# Patient Record
Sex: Female | Born: 1990 | Race: Black or African American | Hispanic: No | Marital: Single | Smoking: Former smoker
Health system: Southern US, Community
[De-identification: ages and names within clinical notes are randomized; demographics above are authoritative.]

## PROBLEM LIST (undated history)

## (undated) ENCOUNTER — Inpatient Hospital Stay (HOSPITAL_COMMUNITY): Payer: Self-pay

## (undated) DIAGNOSIS — N390 Urinary tract infection, site not specified: Secondary | ICD-10-CM

## (undated) DIAGNOSIS — O139 Gestational [pregnancy-induced] hypertension without significant proteinuria, unspecified trimester: Secondary | ICD-10-CM

## (undated) DIAGNOSIS — D573 Sickle-cell trait: Secondary | ICD-10-CM

## (undated) DIAGNOSIS — B009 Herpesviral infection, unspecified: Secondary | ICD-10-CM

## (undated) HISTORY — DX: Gestational (pregnancy-induced) hypertension without significant proteinuria, unspecified trimester: O13.9

## (undated) HISTORY — PX: DILATION AND CURETTAGE OF UTERUS: SHX78

---
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
## 2011-02-23 NOTE — Discharge Summary (Signed)
NAMEMORINE, KOHLMAN            ACCOUNT NO.:  [id number]   MEDICAL RECORD NO.:  [id number]          PATIENT TYPE:  INP   LOCATION:  9309                          FACILITY:  [facility]   PHYSICIAN:  Tracy L. Mayford Knife, M.D.DATE OF BIRTH:  01/20/1991   DATE OF ADMISSION:  11/06/2006  DATE OF DISCHARGE:  11/08/2006                               DISCHARGE SUMMARY   ADMISSION DIAGNOSES:  1. Pyelonephritis.  2. Status post normal spontaneous vaginal delivery 32 days prior to      admission.   DISCHARGE DIAGNOSIS:  Pyelonephritis (Escherichia coli).   LABORATORY:  Urine culture:  Greater than 100,000 colonies of E. coli  sensitive to everything except for ampicillin.  Blood cultures:  No  growth after 5 days.  Hemoglobin 10 and following hydration was 9.1.   HOSPITAL COURSE:  A 20 year old G1, P1-0-0-1 status post normal  spontaneous vaginal delivery 32 days prior to admission presents to MAU  complaining of fever x3 days.  She had a T-max of 103.7 on the day of  admission.  She complained of right flank pain.  Urinalysis was found to  be very dirty and she had positive right CVA tenderness, so she was  admitted for observation and IV antibiotics.  The patient continued to  spike fevers on Rocephin so she was changed to Cipro.  The patient  tolerated the medications well and became afebrile for greater than 24  hours, so she was discharged to home.   FOLLOWUP:  [facility] on [facility], [facility].  She is to come to  the MAU earlier if there are any problems.   DISCHARGE MEDICATIONS:  1. Cipro 500 mg b.i.d. for 14 days.  2. Tylenol p.r.n.           ______________________________  Marc Morgans. Mayford Knife, M.D.     TLW/MEDQ  D:  11/27/2006  T:  11/27/2006  Job:  [id number]

## 2011-03-25 ENCOUNTER — Emergency Department (HOSPITAL_COMMUNITY)
Admission: EM | Admit: 2011-03-25 | Discharge: 2011-03-25 | Disposition: A | Discharge status: Home / Self Care | Payer: Medicaid Other | Attending: Emergency Medicine | Admitting: Emergency Medicine

## 2011-03-25 DIAGNOSIS — D573 Sickle-cell trait: Secondary | ICD-10-CM | POA: Insufficient documentation

## 2011-03-25 DIAGNOSIS — R112 Nausea with vomiting, unspecified: Secondary | ICD-10-CM | POA: Insufficient documentation

## 2011-03-25 DIAGNOSIS — R109 Unspecified abdominal pain: Secondary | ICD-10-CM | POA: Insufficient documentation

## 2011-03-25 LAB — COMPREHENSIVE METABOLIC PANEL
ALT: 13 U/L (ref 0–35)
AST: 26 U/L (ref 0–37)
Alkaline Phosphatase: 70 U/L (ref 39–117)
CO2: 26 mEq/L (ref 19–32)
Calcium: 8.8 mg/dL (ref 8.4–10.5)
Chloride: 107 mEq/L (ref 96–112)
GFR calc Af Amer: >60 mL/min (ref >60)
GFR calc non Af Amer: >60 mL/min (ref >60)
Glucose, Bld: 85 mg/dL (ref 70–99)
Potassium: 3.8 mEq/L (ref 3.5–5.1)
Sodium: 139 mEq/L (ref 135–145)
Total Bilirubin: 0.3 mg/dL (ref 0.3–1.2)

## 2011-03-25 LAB — CBC
HCT: 33.7 % — ABNORMAL LOW (ref 36.0–46.0)
MCHC: 33.8 g/dL (ref 30.0–36.0)
MCV: 82.6 fL (ref 78.0–100.0)
Platelets: 289 10*3/uL (ref 150–400)

## 2011-03-25 LAB — URINALYSIS, ROUTINE W REFLEX MICROSCOPIC
Nitrite: NEGATIVE
Protein, ur: NEGATIVE mg/dL
Urobilinogen, UA: 1 mg/dL (ref 0.0–1.0)

## 2011-03-25 LAB — URINE MICROSCOPIC-ADD ON

## 2011-03-25 LAB — DIFFERENTIAL
Basophils Absolute: 0 10*3/uL (ref 0.0–0.1)
Eosinophils Absolute: 0.1 10*3/uL (ref 0.0–0.7)
Eosinophils Relative: 1 % (ref 0–5)
Lymphocytes Relative: 33 % (ref 12–46)
Monocytes Absolute: 0.4 10*3/uL (ref 0.1–1.0)

## 2011-03-25 LAB — POCT PREGNANCY, URINE: Preg Test, Ur: NEGATIVE

## 2011-03-26 LAB — GC/CHLAMYDIA PROBE AMP, GENITAL
Chlamydia, DNA Probe: NEGATIVE
GC Probe Amp, Genital: NEGATIVE

## 2011-09-18 ENCOUNTER — Encounter: Payer: Self-pay | Admitting: Emergency Medicine

## 2011-09-18 ENCOUNTER — Emergency Department (HOSPITAL_COMMUNITY)
Admission: EM | Admit: 2011-09-18 | Discharge: 2011-09-18 | Disposition: A | Discharge status: Home / Self Care | Payer: Medicaid Other | Attending: Emergency Medicine | Admitting: Emergency Medicine

## 2011-09-18 DIAGNOSIS — IMO0001 Reserved for inherently not codable concepts without codable children: Secondary | ICD-10-CM | POA: Insufficient documentation

## 2011-09-18 DIAGNOSIS — R05 Cough: Secondary | ICD-10-CM | POA: Insufficient documentation

## 2011-09-18 DIAGNOSIS — R51 Headache: Secondary | ICD-10-CM | POA: Insufficient documentation

## 2011-09-18 DIAGNOSIS — R509 Fever, unspecified: Secondary | ICD-10-CM | POA: Insufficient documentation

## 2011-09-18 DIAGNOSIS — R059 Cough, unspecified: Secondary | ICD-10-CM | POA: Insufficient documentation

## 2011-09-18 DIAGNOSIS — R070 Pain in throat: Secondary | ICD-10-CM | POA: Insufficient documentation

## 2011-09-18 DIAGNOSIS — R112 Nausea with vomiting, unspecified: Secondary | ICD-10-CM | POA: Insufficient documentation

## 2011-09-18 DIAGNOSIS — J111 Influenza due to unidentified influenza virus with other respiratory manifestations: Secondary | ICD-10-CM | POA: Insufficient documentation

## 2011-09-18 DIAGNOSIS — R197 Diarrhea, unspecified: Secondary | ICD-10-CM | POA: Insufficient documentation

## 2011-09-18 MED ORDER — NAPROXEN 500 MG PO TABS: 500.0000 mg | ORAL_TABLET | Freq: Two times a day (BID) | ORAL | Status: DC

## 2011-09-18 MED ORDER — KETOROLAC TROMETHAMINE 60 MG/2ML IM SOLN
INTRAMUSCULAR | Status: AC
  Administered 2011-09-18: 60 mg via INTRAMUSCULAR
  Filled 2011-09-18: qty 2

## 2011-09-18 NOTE — ED Notes (Signed)
MD at bedside. 

## 2011-09-18 NOTE — ED Provider Notes (Signed)
History     CSN: [id number] Arrival date & time: 09/18/2011 12:13 AM   First MD Initiated Contact with Patient 09/18/11 0340      Chief Complaint  Patient presents with  . Emesis    (Consider location/radiation/quality/duration/timing/severity/associated sxs/prior treatment) HPI Comments: 20 year old otherwise healthy female, presents with one day of body aches, chills, fever, sore throat, cough, nausea and vomiting with diarrhea. He has a close relative that was just diagnosed with the flu. Symptoms are constant, persistent, nothing makes better or worse, not associated with rash or swelling.  She does have a headache which she sites as the most significant of her symptoms tonight. She said no medication prior to arrival and states that her symptoms are severe she denies stiff neck, change in vision, weakness or numbness  Patient is a 20 y.o. female presenting with vomiting. The history is provided by the patient.  Emesis     History reviewed. No pertinent past medical history.  History reviewed. No pertinent past surgical history.  No family history on file.  History  Substance Use Topics  . Smoking status: Current Everyday Smoker  . Smokeless tobacco: Not on file  . Alcohol Use: No    OB History    Grav Para Term Preterm Abortions TAB SAB Ect Mult Living                  Review of Systems  Gastrointestinal: Positive for vomiting.  All other systems reviewed and are negative.    Allergies  Review of patient's allergies indicates no known allergies.  Home Medications   Current Outpatient Rx  Name Route Sig Dispense Refill  . NAPROXEN 500 MG PO TABS Oral Take 1 tablet (500 mg total) by mouth 2 (two) times daily with a meal. 30 tablet 0    BP 136/68  Pulse 88  Temp(Src) 98.2 F (36.8 C) (Oral)  Resp 20  SpO2 97%  LMP 09/09/2011  Physical Exam  Nursing note and vitals reviewed. Constitutional: She appears well-developed and well-nourished. No distress.   HENT:  Head: Normocephalic and atraumatic.  Mouth/Throat: No oropharyngeal exudate.       Posterior pharynx erythematous, no asymmetry hypertrophy or exudate  Eyes: Conjunctivae and EOM are normal. Pupils are equal, round, and reactive to light. Right eye exhibits no discharge. Left eye exhibits no discharge. No scleral icterus.  Neck: Normal range of motion. Neck supple. No JVD present. No thyromegaly present.  Cardiovascular: Normal rate, regular rhythm, normal heart sounds and intact distal pulses.  Exam reveals no gallop and no friction rub.   No murmur heard. Pulmonary/Chest: Effort normal and breath sounds normal. No respiratory distress. She has no wheezes. She has no rales.  Abdominal: Soft. Bowel sounds are normal. She exhibits no distension and no mass. There is no tenderness.  Musculoskeletal: Normal range of motion. She exhibits no edema and no tenderness.  Lymphadenopathy:    She has no cervical adenopathy.  Neurological: She is alert. Coordination normal.  Skin: Skin is warm and dry. No rash noted. No erythema.  Psychiatric: She has a normal mood and affect. Her behavior is normal.    ED Course  Procedures (including critical care time)  Labs Reviewed - No data to display No results found.   1. Influenza       MDM  Myalgias fevers chills nausea vomiting cough sore throat, this is consistent with influenza, no focal findings on pulmonary exam or vital signs to suggest another source of disease. Also  given close contacts with same disease is most likely. Intramuscular Toradol given, home with Tamiflu as per patient request  Improved after Toradol intramuscular, vital signs stable, home, precautions given for flu      Vida Roller, MD 09/18/11 [phone]

## 2011-09-18 NOTE — ED Notes (Signed)
PT. REPORTS VOMITTING AND DIARRHEA , RUNNY NOSE/NASAL CONGESTION , PRODUCTIVE COUGH , CHILLS AND HEADACHE X SEVERAL DAYS  WITH BODY ACHES.

## 2012-03-29 ENCOUNTER — Encounter (HOSPITAL_COMMUNITY): Payer: Self-pay | Admitting: *Deleted

## 2012-03-29 ENCOUNTER — Inpatient Hospital Stay (HOSPITAL_COMMUNITY)
Admission: AD | Admit: 2012-03-29 | Discharge: 2012-03-29 | Disposition: A | Discharge status: Home / Self Care | Payer: Medicaid Other | Source: Ambulatory Visit | Attending: Family Medicine | Admitting: Family Medicine

## 2012-03-29 DIAGNOSIS — N938 Other specified abnormal uterine and vaginal bleeding: Secondary | ICD-10-CM | POA: Insufficient documentation

## 2012-03-29 DIAGNOSIS — N898 Other specified noninflammatory disorders of vagina: Secondary | ICD-10-CM

## 2012-03-29 DIAGNOSIS — N939 Abnormal uterine and vaginal bleeding, unspecified: Secondary | ICD-10-CM

## 2012-03-29 DIAGNOSIS — R109 Unspecified abdominal pain: Secondary | ICD-10-CM | POA: Insufficient documentation

## 2012-03-29 DIAGNOSIS — N949 Unspecified condition associated with female genital organs and menstrual cycle: Secondary | ICD-10-CM | POA: Insufficient documentation

## 2012-03-29 HISTORY — DX: Herpesviral infection, unspecified: B00.9

## 2012-03-29 LAB — URINALYSIS, ROUTINE W REFLEX MICROSCOPIC
Bilirubin Urine: NEGATIVE
Leukocytes, UA: NEGATIVE
Nitrite: NEGATIVE
Specific Gravity, Urine: 1.025 (ref 1.005–1.030)
Urobilinogen, UA: 0.2 mg/dL (ref 0.0–1.0)
pH: 6 (ref 5.0–8.0)

## 2012-03-29 LAB — URINE MICROSCOPIC-ADD ON

## 2012-03-29 LAB — WET PREP, GENITAL

## 2012-03-29 MED ORDER — KETOROLAC TROMETHAMINE 60 MG/2ML IM SOLN
60.0000 mg | Freq: Once | INTRAMUSCULAR | Status: AC
  Administered 2012-03-29: 60 mg via INTRAMUSCULAR
  Filled 2012-03-29: qty 2

## 2012-03-29 MED ORDER — IBUPROFEN 600 MG PO TABS: 600.0000 mg | ORAL_TABLET | Freq: Four times a day (QID) | ORAL | Status: AC | PRN

## 2012-03-29 NOTE — MAU Provider Note (Signed)
History     CSN: [id number]  Arrival date and time: 03/29/12 9562   First Provider Initiated Contact with Patient 03/29/12 [phone]      Chief Complaint  Patient presents with  . Abdominal Pain  . Vaginal Bleeding   HPI Kara Myers 21 y.o. MAU pregnancy test is negative.  Comes to MAU today with abdominal pain x 4 days and dark vaginal bleeding.  Missed appointment for Depo shot this month.  [facility]  Had TAB in February.  Had vomiting yesterday and thought she might be pregnant so came for evaluation.  OB History    Grav Para Term Preterm Abortions TAB SAB Ect Mult Living   2 1 1  1     1       Past Medical History  Diagnosis Date  . Herpes simplex     Past Surgical History  Procedure Date  . Dilation and curettage of uterus     No family history on file.  History  Substance Use Topics  . Smoking status: Current Everyday Smoker  . Smokeless tobacco: Not on file  . Alcohol Use: No    Allergies: No Known Allergies  Prescriptions prior to admission  Medication Sig Dispense Refill  . medroxyPROGESTERone (DEPO-PROVERA) 150 MG/ML injection Inject 150 mg into the muscle every 3 (three) months. Pt states that she got her last injection in March 2013        Review of Systems  Constitutional: Negative for fever.  Gastrointestinal: Positive for nausea, vomiting and abdominal pain. Negative for diarrhea.  Genitourinary: Negative for dysuria.       Vaginal bleeding   Physical Exam   Blood pressure 124/52, pulse 74, temperature 98.9 F (37.2 C), temperature source Oral, resp. rate 18, height 5\' 3"  (1.6 m), weight 188 lb 6.4 oz (85.458 kg).  Physical Exam  Nursing note and vitals reviewed. Constitutional: She is oriented to person, place, and time. She appears well-developed and well-nourished.  HENT:  Head: Normocephalic.  Eyes: EOM are normal.  Neck: Neck supple.  GI: Soft. There is tenderness. There is no rebound.       Low midline  tenderness  Genitourinary:       Speculum exam: Vagina - Small amount of dark blood, no odor Cervix - No active bleeding Bimanual exam: Cervix closed Uterus tender, normal size Adnexa non tender, no masses bilaterally GC/Chlam, wet prep done Chaperone present for exam.  Musculoskeletal: Normal range of motion.  Neurological: She is alert and oriented to person, place, and time.  Skin: Skin is warm and dry.  Psychiatric: She has a normal mood and affect.    MAU Course  Procedures Results for orders placed during the hospital encounter of 03/29/12 (from the past 24 hour(s))  URINALYSIS, ROUTINE W REFLEX MICROSCOPIC     Status: Abnormal   Collection Time   03/29/12  8:35 AM      Component Value Range   Color, Urine YELLOW  YELLOW   APPearance HAZY (*) CLEAR   Specific Gravity, Urine 1.025  1.005 - 1.030   pH 6.0  5.0 - 8.0   Glucose, UA NEGATIVE  NEGATIVE mg/dL   Hgb urine dipstick LARGE (*) NEGATIVE   Bilirubin Urine NEGATIVE  NEGATIVE   Ketones, ur NEGATIVE  NEGATIVE mg/dL   Protein, ur NEGATIVE  NEGATIVE mg/dL   Urobilinogen, UA 0.2  0.0 - 1.0 mg/dL   Nitrite NEGATIVE  NEGATIVE   Leukocytes, UA NEGATIVE  NEGATIVE  URINE MICROSCOPIC-ADD ON     Status: Abnormal   Collection Time   03/29/12  8:35 AM      Component Value Range   Squamous Epithelial / LPF FEW (*) RARE   WBC, UA 0-2  <3 WBC/hpf   RBC / HPF 3-6  <3 RBC/hpf   Bacteria, UA FEW (*) RARE  WET PREP, GENITAL     Status: Abnormal   Collection Time   03/29/12  9:20 AM      Component Value Range   Yeast Wet Prep HPF POC NONE SEEN  NONE SEEN   Trich, Wet Prep NONE SEEN  NONE SEEN   Clue Cells Wet Prep HPF POC FEW (*) NONE SEEN   WBC, Wet Prep HPF POC FEW (*) NONE SEEN   MDM Toradol 60 mg IM for pain.  Assessment and Plan  Irregular bleeding and cramping  Plan Make an appointment at [facility] to get Depo Can take Ibuprofen 600 gm PO with food q 6 hours as needed for continued pain. Lab tests are  pending.  We will notify you if you need additional treatment. Condoms with every intercourse for pregnancy protection until 2 weeks after your Depo shot is given.   Jeremiah Tarpley 03/29/2012, 9:27 AM

## 2012-03-29 NOTE — MAU Provider Note (Signed)
Chart reviewed and agree with management and plan.  

## 2012-03-29 NOTE — MAU Note (Signed)
Patient presents with c/o abdominal bleeding and irregular bleeding for 3 to 4 days, also bleeding with intercourse, normal vaginal discharge, no dysuria. Had abortion in February, Depo 12/11/11 missed shot in June

## 2012-03-29 NOTE — Discharge Instructions (Signed)
Make an appointment at [facility] to get Depo Can take Ibuprofen 600 gm PO with food q 6 hours as needed for continued pain. Lab tests are pending.  We will notify you if you need additional treatment. Condoms with every intercourse for pregnancy protection until 2 weeks after your Depo shot is given.

## 2012-03-31 LAB — GC/CHLAMYDIA PROBE AMP, GENITAL: Chlamydia, DNA Probe: NEGATIVE

## 2012-07-30 ENCOUNTER — Emergency Department (HOSPITAL_COMMUNITY): Payer: Medicaid Other

## 2012-07-30 ENCOUNTER — Encounter (HOSPITAL_COMMUNITY): Payer: Self-pay

## 2012-07-30 ENCOUNTER — Emergency Department (HOSPITAL_COMMUNITY)
Admission: EM | Admit: 2012-07-30 | Discharge: 2012-07-30 | Disposition: A | Discharge status: Home / Self Care | Payer: Medicaid Other | Attending: Emergency Medicine | Admitting: Emergency Medicine

## 2012-07-30 DIAGNOSIS — IMO0002 Reserved for concepts with insufficient information to code with codable children: Secondary | ICD-10-CM | POA: Insufficient documentation

## 2012-07-30 DIAGNOSIS — T148XXA Other injury of unspecified body region, initial encounter: Secondary | ICD-10-CM

## 2012-07-30 DIAGNOSIS — Z872 Personal history of diseases of the skin and subcutaneous tissue: Secondary | ICD-10-CM | POA: Insufficient documentation

## 2012-07-30 DIAGNOSIS — Y929 Unspecified place or not applicable: Secondary | ICD-10-CM | POA: Insufficient documentation

## 2012-07-30 DIAGNOSIS — Y9389 Activity, other specified: Secondary | ICD-10-CM | POA: Insufficient documentation

## 2012-07-30 DIAGNOSIS — W108XXA Fall (on) (from) other stairs and steps, initial encounter: Secondary | ICD-10-CM | POA: Insufficient documentation

## 2012-07-30 DIAGNOSIS — F17200 Nicotine dependence, unspecified, uncomplicated: Secondary | ICD-10-CM | POA: Insufficient documentation

## 2012-07-30 DIAGNOSIS — S63509A Unspecified sprain of unspecified wrist, initial encounter: Secondary | ICD-10-CM | POA: Insufficient documentation

## 2012-07-30 MED ORDER — OXYCODONE-ACETAMINOPHEN 5-325 MG PO TABS
2.0000 | ORAL_TABLET | Freq: Once | ORAL | Status: AC
  Administered 2012-07-30: 2 via ORAL
  Filled 2012-07-30: qty 2

## 2012-07-30 MED ORDER — METHOCARBAMOL 750 MG PO TABS: 750.0000 mg | ORAL_TABLET | Freq: Four times a day (QID) | ORAL | Status: DC

## 2012-07-30 MED ORDER — OXYCODONE-ACETAMINOPHEN 5-325 MG PO TABS: 2.0000 | ORAL_TABLET | ORAL | Status: DC | PRN

## 2012-07-30 NOTE — ED Notes (Signed)
Pt sts was carrying several things and fell down steps complains of lfet leg pain.

## 2012-07-30 NOTE — ED Notes (Signed)
Pt states she has ride coming to pick her up.

## 2012-07-30 NOTE — ED Notes (Signed)
C/o pain right thigh and left wrist.

## 2012-07-30 NOTE — ED Provider Notes (Signed)
History   This chart was scribed for Toy Baker, MD by Charolett Bumpers . The patient was seen in room TR04C/TR04C. Patient's care was started at 1337.   CSN: [id number] Arrival date & time 07/30/12  1303  First MD Initiated Contact with Patient 07/30/12 1337      Chief Complaint  Patient presents with  . Fall    The history is provided by the patient. No language interpreter was used.   Kara Myers is a 21 y.o. female who presents to the Emergency Department complaining of constant, moderate left leg pain and right wrist pain after falling PTA. She states most of her pain is located in left hip. She states that she fell down approximately 12 steps while carrying several items. She denies any LOC or hitting her head. She denies any neck, back pain, chest pain or abdominal pain.    Past Medical History  Diagnosis Date  . Herpes simplex     Past Surgical History  Procedure Date  . Dilation and curettage of uterus     No family history on file.  History  Substance Use Topics  . Smoking status: Current Every Day Smoker  . Smokeless tobacco: Not on file  . Alcohol Use: No    OB History    Grav Para Term Preterm Abortions TAB SAB Ect Mult Living   2 1 1  1     1       Review of Systems  Constitutional: Negative for fever and chills.  HENT: Negative for neck pain.   Respiratory: Negative for shortness of breath.   Cardiovascular: Negative for chest pain.  Gastrointestinal: Negative for nausea, vomiting and abdominal pain.  Musculoskeletal: Positive for arthralgias. Negative for back pain.       Left hip pain and right wrist pain.  Neurological: Negative for weakness.  All other systems reviewed and are negative.    Allergies  Review of patient's allergies indicates no known allergies.  Home Medications  No current outpatient prescriptions on file.  BP 133/68  Pulse 62  Temp 97.9 F (36.6 C) (Oral)  Resp 18  SpO2 98%  Physical Exam    Nursing note and vitals reviewed. Constitutional: She is oriented to person, place, and time. She appears well-developed and well-nourished.  Non-toxic appearance.  HENT:  Head: Normocephalic and atraumatic.  Eyes: Conjunctivae normal are normal. Pupils are equal, round, and reactive to light.  Neck: Normal range of motion.  Cardiovascular: Normal rate.   Pulmonary/Chest: Effort normal. She exhibits no tenderness.  Abdominal: Soft. There is no tenderness.  Musculoskeletal: Normal range of motion. She exhibits tenderness. She exhibits no edema.       No cervical, thoracic or lumbar spinal tenderness. Pain in left hip with rotation of left leg. No swelling or deformity of left knee or ankle. Neurovascularly intact. Skin intact at left hip. No ecchymosis to left hip. Right wrist pain with flexion.    Neurological: She is alert and oriented to person, place, and time.  Skin: Skin is warm and dry.  Psychiatric: She has a normal mood and affect.    ED Course  Procedures (including critical care time)  DIAGNOSTIC STUDIES: Oxygen Saturation is 98% on room air, normal by my interpretation.    COORDINATION OF CARE:  13:42-Discussed planned course of treatment with the patient including a pregnancy test, x-ray of left hip and right wrist and pain medication, who is agreeable at this time.   13:45-Medication Orders: Oxycodone-acetaminophen (  Percocet/Roxicet) 5-325 mg per tablet 2 tablet-once.   Results for orders placed during the hospital encounter of 07/30/12  POCT PREGNANCY, URINE      Component Value Range   Preg Test, Ur NEGATIVE  NEGATIVE   Dg Wrist Complete Right  07/30/2012  *RADIOLOGY REPORT*  Clinical Data: Larey Seat down stairs  RIGHT WRIST - COMPLETE 3+ VIEW  Comparison: None.  Findings: Normal alignment.  Negative for fracture.  No degenerative change is identified.  IMPRESSION: Negative   Original Report Authenticated By: Camelia Phenes, M.D.    Dg Hip 1 View Left  07/30/2012   *RADIOLOGY REPORT*  Clinical Data: Larey Seat down steps  LEFT HIP - 1 VIEW:  Comparison: None.  Findings: Negative for fracture.  Normal alignment on the single view.  No bony abnormality.  IMPRESSION: Normal   Original Report Authenticated By: Camelia Phenes, M.D.       No diagnosis found.    MDM   Patient Motrin for pain. X-rays negative for fracture. Suspect musculoskeletal strain.  I personally performed the services described in this documentation, which was scribed in my presence. The recorded information has been reviewed and considered.     Toy Baker, MD 07/30/12 1525

## 2012-07-30 NOTE — ED Notes (Signed)
Pt unsure of last period. States has been irregular since coming off Depo shots.

## 2012-10-22 ENCOUNTER — Inpatient Hospital Stay (HOSPITAL_COMMUNITY)
Admission: AD | Admit: 2012-10-22 | Discharge: 2012-10-22 | Disposition: A | Discharge status: Home / Self Care | Payer: Medicaid Other | Source: Ambulatory Visit | Attending: Obstetrics & Gynecology | Admitting: Obstetrics & Gynecology

## 2012-10-22 ENCOUNTER — Encounter (HOSPITAL_COMMUNITY): Payer: Self-pay | Admitting: *Deleted

## 2012-10-22 DIAGNOSIS — A084 Viral intestinal infection, unspecified: Secondary | ICD-10-CM

## 2012-10-22 DIAGNOSIS — A088 Other specified intestinal infections: Secondary | ICD-10-CM | POA: Insufficient documentation

## 2012-10-22 DIAGNOSIS — R109 Unspecified abdominal pain: Secondary | ICD-10-CM

## 2012-10-22 DIAGNOSIS — R1110 Vomiting, unspecified: Secondary | ICD-10-CM

## 2012-10-22 LAB — URINALYSIS, ROUTINE W REFLEX MICROSCOPIC
Bilirubin Urine: NEGATIVE
Glucose, UA: NEGATIVE mg/dL
Hgb urine dipstick: NEGATIVE
Ketones, ur: NEGATIVE mg/dL
Protein, ur: NEGATIVE mg/dL
Urobilinogen, UA: 0.2 mg/dL (ref 0.0–1.0)

## 2012-10-22 MED ORDER — ONDANSETRON 8 MG PO TBDP
8.0000 mg | ORAL_TABLET | Freq: Once | ORAL | Status: AC
  Administered 2012-10-22: 8 mg via ORAL
  Filled 2012-10-22: qty 1

## 2012-10-22 MED ORDER — ONDANSETRON HCL 8 MG PO TABS: 8.0000 mg | ORAL_TABLET | Freq: Three times a day (TID) | ORAL | Status: DC | PRN

## 2012-10-22 NOTE — Progress Notes (Signed)
Kara Myers CNM informed of pt's refusal of po fluids

## 2012-10-22 NOTE — MAU Note (Signed)
Pt states she started having nausea and vomiting last night x3  And x2 today. Pt states she has also had crampy  Pain that started last night as well

## 2012-10-22 NOTE — MAU Note (Signed)
Denies cough, fever or sore throat. Period was when expected

## 2012-10-22 NOTE — MAU Provider Note (Signed)
History     CSN: [id number]  Arrival date and time: 10/22/12 1701   First Provider Initiated Contact with Patient 10/22/12 1755      Chief Complaint  Patient presents with  . Emesis   HPI This is a 22 y.o. female who presents with c/o nausea and vomiting since last night. Not pregnant, but thought she might be, which is why she came. Has not taken anything. Has some cramps in belly that move around, No fever or diarrhea.  RN Note: Pt states she started having nausea and vomiting last night x3 And x2 today. Pt states she has also had crampy Pain that started last night as well      OB History    Grav Para Term Preterm Abortions TAB SAB Ect Mult Living   2 1 1  1     1       Past Medical History  Diagnosis Date  . Herpes simplex     Past Surgical History  Procedure Date  . Dilation and curettage of uterus     Family History  Problem Relation Age of Onset  . Diabetes Mother   . Diabetes Father     History  Substance Use Topics  . Smoking status: Current Every Day Smoker  . Smokeless tobacco: Not on file  . Alcohol Use: No    Allergies: No Known Allergies  No prescriptions prior to admission    Review of Systems  Constitutional: Positive for malaise/fatigue. Negative for fever and chills.  Gastrointestinal: Positive for nausea, vomiting and abdominal pain. Negative for diarrhea, constipation and blood in stool.  Genitourinary: Negative for dysuria.  Musculoskeletal: Negative for myalgias.  Neurological: Negative for dizziness.   Physical Exam   Blood pressure 128/71, pulse 80, temperature 98.9 F (37.2 C), temperature source Oral, resp. rate 18, height 5' 3.5" (1.613 m), weight 92.08 kg (203 lb), last menstrual period 10/16/2012, SpO2 100.00%.  Physical Exam  Constitutional: She is oriented to person, place, and time. She appears well-developed and well-nourished. No distress.  Cardiovascular: Normal rate.   Respiratory: Effort normal.  GI: Soft. She  exhibits no distension and no mass. There is tenderness (slightly diffusely tender). There is no rebound and no guarding.  Musculoskeletal: Normal range of motion.  Neurological: She is alert and oriented to person, place, and time.  Skin: Skin is warm and dry.  Psychiatric: She has a normal mood and affect.   Results for orders placed during the hospital encounter of 10/22/12 (from the past 72 hour(s))  URINALYSIS, ROUTINE W REFLEX MICROSCOPIC     Status: Normal   Collection Time   10/22/12  5:17 PM      Component Value Range Comment   Color, Urine YELLOW  YELLOW    APPearance CLEAR  CLEAR    Specific Gravity, Urine 1.010  1.005 - 1.030    pH 7.0  5.0 - 8.0    Glucose, UA NEGATIVE  NEGATIVE mg/dL    Hgb urine dipstick NEGATIVE  NEGATIVE    Bilirubin Urine NEGATIVE  NEGATIVE    Ketones, ur NEGATIVE  NEGATIVE mg/dL    Protein, ur NEGATIVE  NEGATIVE mg/dL    Urobilinogen, UA 0.2  0.0 - 1.0 mg/dL    Nitrite NEGATIVE  NEGATIVE    Leukocytes, UA NEGATIVE  NEGATIVE MICROSCOPIC NOT DONE ON URINES WITH NEGATIVE PROTEIN, BLOOD, LEUKOCYTES, NITRITE, OR GLUCOSE <1000 mg/dL.  POCT PREGNANCY, URINE     Status: Normal   Collection Time   10/22/12  5:22 PM      Component Value Range Comment   Preg Test, Ur NEGATIVE  NEGATIVE     MAU Course  Procedures  MDM Urine shows no evidence of dehydration. Will try one dose of Zofran and PO challenge.  Assessment and Plan  A:  Nausea and vomiting, probably viral gastroenteritis       P:  Discharged home.   Medication List     As of 10/24/2012  7:53 PM    START taking these medications         ondansetron 8 MG tablet   Commonly known as: ZOFRAN   Take 1 tablet (8 mg total) by mouth every 8 (eight) hours as needed for nausea.          Where to get your medications    These are the prescriptions that you need to pick up. We sent them to a specific pharmacy, so you will need to go there to get them.   [id number] [place], [place] - [address]    [address] [place] [address]    Phone: [phone]        ondansetron 8 MG tablet           PO fluids at home as tolerated  [facility] 10/22/2012, 6:07 PM

## 2012-10-22 NOTE — Progress Notes (Signed)
Pt given po medication for nausea

## 2012-10-22 NOTE — MAU Note (Signed)
abd cramping- started last night. Vomiting started last night- has continued.  Denies diarrhea.

## 2012-10-27 NOTE — MAU Provider Note (Signed)
Attestation of Attending Supervision of Advanced Practitioner (PA/CNM/NP): Evaluation and management procedures were performed by the Advanced Practitioner under my supervision and collaboration.  I have reviewed the Advanced Practitioner's note and chart, and I agree with the management and plan.  Paula Busenbark, MD, FACOG Attending Obstetrician & Gynecologist Faculty Practice, [facility]  

## 2012-12-04 ENCOUNTER — Encounter (HOSPITAL_COMMUNITY): Payer: Self-pay | Admitting: *Deleted

## 2012-12-04 ENCOUNTER — Emergency Department (INDEPENDENT_AMBULATORY_CARE_PROVIDER_SITE_OTHER)
Admission: EM | Admit: 2012-12-04 | Discharge: 2012-12-04 | Disposition: A | Discharge status: Home / Self Care | Payer: Medicaid Other | Source: Home / Self Care

## 2012-12-04 DIAGNOSIS — H1030 Unspecified acute conjunctivitis, unspecified eye: Secondary | ICD-10-CM

## 2012-12-04 MED ORDER — TOBRAMYCIN-DEXAMETHASONE 0.3-0.1 % OP SUSP: 1.0000 [drp] | OPHTHALMIC | Status: DC

## 2012-12-04 MED ORDER — AZELASTINE HCL 0.05 % OP SOLN: 1.0000 [drp] | Freq: Two times a day (BID) | OPHTHALMIC | Status: DC

## 2012-12-04 NOTE — ED Provider Notes (Signed)
Medical screening examination/treatment/procedure(s) were performed by non-physician practitioner and as supervising physician I was immediately available for consultation/collaboration.  Leslee Home, M.D.  Reuben Likes, MD 12/04/12 2102

## 2012-12-04 NOTE — ED Notes (Signed)
Pt reports  She  Woke  Up  This  Am  with a  Red  Irritated  r  Eye    denys  Any  Injury  denys  Any  Known  fb

## 2012-12-04 NOTE — ED Provider Notes (Signed)
History     CSN: [id number]  Arrival date & time 12/04/12  1127   First MD Initiated Contact with Patient 12/04/12 1146      Chief Complaint  Patient presents with  . Conjunctivitis    (Consider location/radiation/quality/duration/timing/severity/associated sxs/prior treatment) HPI Comments: Pleasant 22 year old female awoke this morning with right eye erythema and irritation. She denies foreign body sensation. She has minor itching and copious amount of tearing from both eyes.  The right conjunctiva are erythematous with mild swelling. There are 3 small, 2-4 mm areas of some conjunctival hemorrhage of the right eye. Sclera mildly injected. The upper lids with mild swelling 4/puffiness. No purulence is seen in either eye. She states that there is soreness when she looks to the right. Vision is unaffected. No diplopia or blurring beyond that which is caused by the tearing. She denies headache but awoke with scratchy throat and minor nasal stuffiness today. She has a history of allergies but she has never had an ophthalmic reaction like this before.   Past Medical History  Diagnosis Date  . Herpes simplex     Past Surgical History  Procedure Laterality Date  . Dilation and curettage of uterus      Family History  Problem Relation Age of Onset  . Diabetes Mother   . Diabetes Father     History  Substance Use Topics  . Smoking status: Current Every Day Smoker  . Smokeless tobacco: Not on file  . Alcohol Use: No    OB History   Grav Para Term Preterm Abortions TAB SAB Ect Mult Living   2 1 1  1     1       Review of Systems  Constitutional: Negative.   HENT: Positive for congestion. Negative for hearing loss, ear pain, sore throat, facial swelling, rhinorrhea, mouth sores, neck pain, neck stiffness, postnasal drip, sinus pressure and ear discharge.        Mild scratchy throat the  Eyes: Positive for pain, discharge, redness and itching. Negative for photophobia and  visual disturbance.  Respiratory: Negative.   Gastrointestinal: Negative.   Endocrine: Negative.   Genitourinary: Negative.   Musculoskeletal: Negative.   Allergic/Immunologic: Positive for environmental allergies. Negative for immunocompromised state.  Psychiatric/Behavioral: Negative.     Allergies  Review of patient's allergies indicates no known allergies.  Home Medications   Current Outpatient Rx  Name  Route  Sig  Dispense  Refill  . azelastine (OPTIVAR) 0.05 % ophthalmic solution   Both Eyes   Place 1 drop into both eyes 2 (two) times daily.   6 mL   12   . ondansetron (ZOFRAN) 8 MG tablet   Oral   Take 1 tablet (8 mg total) by mouth every 8 (eight) hours as needed for nausea.   20 tablet   0   . tobramycin-dexamethasone (TOBRADEX) ophthalmic solution   Both Eyes   Place 1 drop into both eyes every 4 (four) hours while awake.   5 mL   0     BP 119/74  Pulse 81  Temp(Src) 97.8 F (36.6 C) (Oral)  SpO2 99%  LMP 12/04/2012  Physical Exam  Nursing note and vitals reviewed. Constitutional: She is oriented to person, place, and time. She appears well-developed and well-nourished.  HENT:  Head: Normocephalic and atraumatic.  Right Ear: External ear normal.  Left Ear: External ear normal.  Nose: Nose normal.  Mouth/Throat: Oropharynx is clear and moist. No oropharyngeal exudate.  Eyes: EOM are normal.  Pupils are equal, round, and reactive to light. Right eye exhibits discharge. Left eye exhibits discharge. No scleral icterus.  CV history of present illness for eye exam.  Neck: Normal range of motion. Neck supple.  Cardiovascular: Normal rate.   Pulmonary/Chest: Effort normal.  Lymphadenopathy:    She has no cervical adenopathy.  Neurological: She is alert and oriented to person, place, and time.  Skin: Skin is warm and dry.  Psychiatric: She has a normal mood and affect.    ED Course  Procedures (including critical care time)  Labs Reviewed - No data  to display No results found.   1. Conjunctivitis, acute, bilateral       MDM  The patient does have a history of allergies but has never had an ophthalmic reaction like she is having today. Her eyes are constantly tearing. The fluid is clear and relatively thin color is teardrops. Although there is no purulence seen give her a combination of steroid and antibiotic drops to help with symptoms and to prevent secondary bacterial infections. TobraDex 1 drop in each every 4 hours while awake.  I will give her a second prescription Optivar one drop in each eye twice a day to help with allergy symptoms. She can hold onto this prescription and use it if the TobraDex is not sufficiently happy with symptoms. If she develops new symptoms such as worsening, increased redness, change in vision, double vision increased pain or other associated symptoms she should followup the ophthalmologist on call. That name and number is given above. She also has the opportunity to return to urgent care or the emergency department for these types of worsening symptoms.         Hayden Rasmussen, NP 12/04/12 1228

## 2013-04-16 ENCOUNTER — Encounter (HOSPITAL_COMMUNITY): Payer: Self-pay | Admitting: Emergency Medicine

## 2013-04-16 ENCOUNTER — Emergency Department (HOSPITAL_COMMUNITY): Payer: Self-pay

## 2013-04-16 ENCOUNTER — Emergency Department (HOSPITAL_COMMUNITY)
Admission: EM | Admit: 2013-04-16 | Discharge: 2013-04-16 | Disposition: A | Discharge status: Home / Self Care | Payer: Self-pay | Attending: Emergency Medicine | Admitting: Emergency Medicine

## 2013-04-16 DIAGNOSIS — Z3202 Encounter for pregnancy test, result negative: Secondary | ICD-10-CM | POA: Insufficient documentation

## 2013-04-16 DIAGNOSIS — R0602 Shortness of breath: Secondary | ICD-10-CM | POA: Insufficient documentation

## 2013-04-16 DIAGNOSIS — R079 Chest pain, unspecified: Secondary | ICD-10-CM

## 2013-04-16 DIAGNOSIS — F17200 Nicotine dependence, unspecified, uncomplicated: Secondary | ICD-10-CM | POA: Insufficient documentation

## 2013-04-16 DIAGNOSIS — R072 Precordial pain: Secondary | ICD-10-CM | POA: Insufficient documentation

## 2013-04-16 DIAGNOSIS — Z8619 Personal history of other infectious and parasitic diseases: Secondary | ICD-10-CM | POA: Insufficient documentation

## 2013-04-16 LAB — URINALYSIS, ROUTINE W REFLEX MICROSCOPIC
Bilirubin Urine: NEGATIVE
Glucose, UA: NEGATIVE mg/dL
Ketones, ur: NEGATIVE mg/dL
Nitrite: NEGATIVE
Protein, ur: NEGATIVE mg/dL
pH: 7.5 (ref 5.0–8.0)

## 2013-04-16 LAB — BASIC METABOLIC PANEL
BUN: 8 mg/dL (ref 6–23)
Calcium: 9.5 mg/dL (ref 8.4–10.5)
Chloride: 103 mEq/L (ref 96–112)
Creatinine, Ser: 0.68 mg/dL (ref 0.50–1.10)
GFR calc Af Amer: >90 mL/min (ref >90)

## 2013-04-16 LAB — CBC WITH DIFFERENTIAL/PLATELET
Basophils Absolute: 0 10*3/uL (ref 0.0–0.1)
Basophils Relative: 0 % (ref 0–1)
Eosinophils Relative: 0 % (ref 0–5)
HCT: 36 % (ref 36.0–46.0)
Hemoglobin: 12.2 g/dL (ref 12.0–15.0)
MCH: 28.6 pg (ref 26.0–34.0)
MCHC: 33.9 g/dL (ref 30.0–36.0)
MCV: 84.3 fL (ref 78.0–100.0)
Monocytes Absolute: 0.3 10*3/uL (ref 0.1–1.0)
Monocytes Relative: 4 % (ref 3–12)
RDW: 12.3 % (ref 11.5–15.5)

## 2013-04-16 LAB — URINE MICROSCOPIC-ADD ON

## 2013-04-16 MED ORDER — NAPROXEN 500 MG PO TABS: 500.0000 mg | ORAL_TABLET | Freq: Two times a day (BID) | ORAL | Status: DC

## 2013-04-16 MED ORDER — ONDANSETRON HCL 4 MG/2ML IJ SOLN
4.0000 mg | Freq: Once | INTRAMUSCULAR | Status: AC
  Administered 2013-04-16: 4 mg via INTRAVENOUS
  Filled 2013-04-16: qty 2

## 2013-04-16 MED ORDER — CYCLOBENZAPRINE HCL 10 MG PO TABS: 10.0000 mg | ORAL_TABLET | Freq: Two times a day (BID) | ORAL | Status: DC | PRN

## 2013-04-16 MED ORDER — MORPHINE SULFATE 4 MG/ML IJ SOLN
4.0000 mg | Freq: Once | INTRAMUSCULAR | Status: AC
  Administered 2013-04-16: 4 mg via INTRAVENOUS
  Filled 2013-04-16: qty 1

## 2013-04-16 NOTE — ED Notes (Signed)
Per report per [facility] pt states that she awoke with upper sternal Chest pressure.  No distress noted.  Resp symmetrical and unlabored.

## 2013-04-16 NOTE — ED Provider Notes (Signed)
History    CSN: [id number] Arrival date & time 04/16/13  8295  First MD Initiated Contact with Patient 04/16/13 [phone]     Chief Complaint  Patient presents with  . Chest Pain   (Consider location/radiation/quality/duration/timing/severity/associated sxs/prior Treatment) HPI Comments: Patient is a 22 year old female with no significant past medical history who presents with chest pain that woke her from sleep this morning. Patient reports severe chest pressure located in her central chest with radiation to both sides of chest. Patient reports associated SOB. She continues to have the chest pain in the ED. No aggravating/alleviating factors. No exogenous estrogen, recent travel, recent surgery, previous DVT/PE.   Past Medical History  Diagnosis Date  . Herpes simplex    Past Surgical History  Procedure Laterality Date  . Dilation and curettage of uterus     Family History  Problem Relation Age of Onset  . Diabetes Mother   . Diabetes Father    History  Substance Use Topics  . Smoking status: Current Every Day Smoker  . Smokeless tobacco: Not on file  . Alcohol Use: No   OB History   Grav Para Term Preterm Abortions TAB SAB Ect Mult Living   2 1 1  1     1      Review of Systems  Respiratory: Positive for shortness of breath.   Cardiovascular: Positive for chest pain.  All other systems reviewed and are negative.    Allergies  Review of patient's allergies indicates no known allergies.  Home Medications  No current outpatient prescriptions on file. BP 128/65  Pulse 62  Temp(Src) 98.9 F (37.2 C)  Resp 12  SpO2 99% Physical Exam  Nursing note and vitals reviewed. Constitutional: She is oriented to person, place, and time. She appears well-developed and well-nourished. No distress.  HENT:  Head: Normocephalic and atraumatic.  Eyes: Conjunctivae and EOM are normal.  Neck: Normal range of motion.  Cardiovascular: Normal rate and regular rhythm.  Exam reveals no  gallop and no friction rub.   No murmur heard. Pulmonary/Chest: Effort normal and breath sounds normal. She has no wheezes. She has no rales. She exhibits tenderness.  Central chest tenderness to palpation. No obvious deformity.   Abdominal: Soft. She exhibits no distension. There is no tenderness. There is no rebound and no guarding.  Musculoskeletal: Normal range of motion.  Neurological: She is alert and oriented to person, place, and time. Coordination normal.  Speech is goal-oriented. Moves limbs without ataxia.   Skin: Skin is warm and dry.  Psychiatric: She has a normal mood and affect. Her behavior is normal.    ED Course  Procedures (including critical care time)   Date: 04/16/2013  Rate: 62  Rhythm: normal sinus rhythm  QRS Axis: normal  Intervals: normal  ST/T Wave abnormalities: normal  Conduction Disutrbances:none  Narrative Interpretation: NSR  Old EKG Reviewed: none available    Labs Reviewed  CBC WITH DIFFERENTIAL  BASIC METABOLIC PANEL  URINALYSIS, ROUTINE W REFLEX MICROSCOPIC  POCT PREGNANCY, URINE   Dg Chest 2 View  04/16/2013   *RADIOLOGY REPORT*  Clinical Data: Mid sternal chest pain for 1 hour  CHEST - 2 VIEW  Comparison: None  Findings: Upper-normal size of cardiac silhouette. Mediastinal contours and pulmonary vascularity normal. Minimal peribronchial thickening. No acute infiltrate, pleural effusion or pneumothorax. Bones unremarkable.  IMPRESSION: No acute abnormalities.   Original Report Authenticated By: Ulyses Southward, M.D.   1. Chest pain     MDM  9:18 AM Labs and chest xray pending. Vitals stable and patient afebrile. Patient will have pain medication.   11:11 AM Patient is PERC negative.   11:41 AM Urine pregnancy test negative. Patient will go home with anti-inflammatory medication and muscle relaxer. Patient instructed to follow up with worsening or concerning symptoms.      Emilia Beck, PA-C 04/16/13 1145

## 2013-04-16 NOTE — ED Provider Notes (Signed)
Medical screening examination/treatment/procedure(s) were performed by non-physician practitioner and as supervising physician I was immediately available for consultation/collaboration.   Lebert Lovern B. Bernette Mayers, MD 04/16/13 1150

## 2013-04-18 LAB — URINE CULTURE

## 2013-04-19 ENCOUNTER — Telehealth (HOSPITAL_COMMUNITY): Payer: Self-pay | Admitting: Emergency Medicine

## 2013-04-19 NOTE — ED Notes (Signed)
Post ED Visit - Positive Culture Follow-up  Culture report reviewed by antimicrobial stewardship pharmacist: [x]  Marlou Sa, Pharm.D., BCPS []  Celedonio Miyamoto, Pharm.D., BCPS []  Georgina Pillion, Pharm.D., BCPS []  [place], [address].D., BCPS, AAHIVP []  Estella Husk, Pharm.D., BCPS, AAHIVP  Positive urine culture Per Victorino Dike Piepenbrink PA-C, Asymptomatic bacteruria and no further patient follow-up is required at this time.  Kylie A Holland 04/19/2013, 4:55 PM

## 2013-04-19 NOTE — Progress Notes (Signed)
  ED Antimicrobial Stewardship Positive Culture Follow Up   Kara Myers is an 22 y.o. female who presented to [facility] on 04/16/2013 with a chief complaint of chest pain.   Chief Complaint  Patient presents with  . Chest Pain    Recent Results (from the past 720 hour(s))  URINE CULTURE     Status: None   Collection Time    04/16/13 11:08 AM      Result Value Range Status   Specimen Description URINE, CLEAN CATCH   Final   Special Requests NONE   Final   Culture  Setup Time 04/16/2013 18:06   Final   Colony Count >=100,000 COLONIES/ML   Final   Culture ESCHERICHIA COLI   Final   Report Status 04/18/2013 FINAL   Final   Organism ID, Bacteria ESCHERICHIA COLI   Final   No UTI symptoms reported. Most likely asymptomatic bacteruria - no treatment is indicated.  ED Provider: Francee Piccolo, PA-C   Cleon Dew 04/19/2013, 4:55 PM Infectious Diseases Pharmacist Phone# [phone]

## 2013-07-02 ENCOUNTER — Emergency Department (HOSPITAL_COMMUNITY)
Admission: EM | Admit: 2013-07-02 | Discharge: 2013-07-02 | Disposition: A | Discharge status: Home / Self Care | Payer: Medicaid Other | Attending: Emergency Medicine | Admitting: Emergency Medicine

## 2013-07-02 ENCOUNTER — Encounter (HOSPITAL_COMMUNITY): Payer: Self-pay

## 2013-07-02 DIAGNOSIS — Z87891 Personal history of nicotine dependence: Secondary | ICD-10-CM | POA: Insufficient documentation

## 2013-07-02 DIAGNOSIS — R112 Nausea with vomiting, unspecified: Secondary | ICD-10-CM

## 2013-07-02 DIAGNOSIS — M549 Dorsalgia, unspecified: Secondary | ICD-10-CM

## 2013-07-02 DIAGNOSIS — M545 Low back pain, unspecified: Secondary | ICD-10-CM | POA: Insufficient documentation

## 2013-07-02 DIAGNOSIS — Z3201 Encounter for pregnancy test, result positive: Secondary | ICD-10-CM | POA: Insufficient documentation

## 2013-07-02 DIAGNOSIS — R197 Diarrhea, unspecified: Secondary | ICD-10-CM | POA: Insufficient documentation

## 2013-07-02 DIAGNOSIS — Z8619 Personal history of other infectious and parasitic diseases: Secondary | ICD-10-CM | POA: Insufficient documentation

## 2013-07-02 DIAGNOSIS — R110 Nausea: Secondary | ICD-10-CM | POA: Insufficient documentation

## 2013-07-02 DIAGNOSIS — R51 Headache: Secondary | ICD-10-CM | POA: Insufficient documentation

## 2013-07-02 DIAGNOSIS — Z3490 Encounter for supervision of normal pregnancy, unspecified, unspecified trimester: Secondary | ICD-10-CM

## 2013-07-02 DIAGNOSIS — O9989 Other specified diseases and conditions complicating pregnancy, childbirth and the puerperium: Secondary | ICD-10-CM | POA: Insufficient documentation

## 2013-07-02 DIAGNOSIS — O210 Mild hyperemesis gravidarum: Secondary | ICD-10-CM | POA: Insufficient documentation

## 2013-07-02 LAB — CBC WITH DIFFERENTIAL/PLATELET
Basophils Absolute: 0 10*3/uL (ref 0.0–0.1)
Basophils Relative: 0 % (ref 0–1)
Eosinophils Relative: 1 % (ref 0–5)
HCT: 34.1 % — ABNORMAL LOW (ref 36.0–46.0)
Lymphocytes Relative: 27 % (ref 12–46)
MCV: 85.3 fL (ref 78.0–100.0)
Monocytes Absolute: 0.4 10*3/uL (ref 0.1–1.0)
Neutrophils Relative %: 66 % (ref 43–77)
Platelets: 302 10*3/uL (ref 150–400)
RBC: 4 MIL/uL (ref 3.87–5.11)
RDW: 12.1 % (ref 11.5–15.5)
WBC: 7.1 10*3/uL (ref 4.0–10.5)

## 2013-07-02 LAB — COMPREHENSIVE METABOLIC PANEL
Albumin: 3.5 g/dL (ref 3.5–5.2)
Alkaline Phosphatase: 59 U/L (ref 39–117)
CO2: 24 mEq/L (ref 19–32)
Calcium: 9.3 mg/dL (ref 8.4–10.5)
GFR calc Af Amer: >90 mL/min (ref >90)
Glucose, Bld: 95 mg/dL (ref 70–99)
Sodium: 135 mEq/L (ref 135–145)
Total Bilirubin: 0.2 mg/dL — ABNORMAL LOW (ref 0.3–1.2)
Total Protein: 7.4 g/dL (ref 6.0–8.3)

## 2013-07-02 LAB — URINALYSIS, ROUTINE W REFLEX MICROSCOPIC
Bilirubin Urine: NEGATIVE
Ketones, ur: NEGATIVE mg/dL
Nitrite: NEGATIVE
Protein, ur: NEGATIVE mg/dL
Urobilinogen, UA: 0.2 mg/dL (ref 0.0–1.0)

## 2013-07-02 LAB — URINE MICROSCOPIC-ADD ON

## 2013-07-02 LAB — LIPASE, BLOOD: Lipase: 15 U/L (ref 11–59)

## 2013-07-02 LAB — WET PREP, GENITAL: Clue Cells Wet Prep HPF POC: NONE SEEN

## 2013-07-02 MED ORDER — METOCLOPRAMIDE HCL 5 MG/ML IJ SOLN
10.0000 mg | INTRAMUSCULAR | Status: AC
  Administered 2013-07-02: 10 mg via INTRAVENOUS
  Filled 2013-07-02: qty 2

## 2013-07-02 MED ORDER — ONDANSETRON HCL 4 MG PO TABS: 4.0000 mg | ORAL_TABLET | Freq: Four times a day (QID) | ORAL | Status: DC

## 2013-07-02 MED ORDER — SODIUM CHLORIDE 0.9 % IV BOLUS (SEPSIS)
1000.0000 mL | Freq: Once | INTRAVENOUS | Status: AC
  Administered 2013-07-02: 1000 mL via INTRAVENOUS

## 2013-07-02 MED ORDER — ACETAMINOPHEN 500 MG PO TABS: 1000.0000 mg | ORAL_TABLET | Freq: Once | ORAL | Status: DC

## 2013-07-02 NOTE — ED Notes (Signed)
Pt here for nasuea and vomiting, diarrhea, and abd pain and migraine. Onset last pm,

## 2013-07-02 NOTE — ED Notes (Signed)
Removed pt's IV before pt left for discharge 

## 2013-07-02 NOTE — ED Notes (Signed)
Pt did try aleve for headache with little relief.

## 2013-07-02 NOTE — ED Notes (Signed)
Pt is pregnant, no prenatal care at this point, lmp July 18

## 2013-07-02 NOTE — ED Provider Notes (Signed)
CSN: [id number]     Arrival date & time 07/02/13  1129 History   First MD Initiated Contact with Patient 07/02/13 1352     Chief Complaint  Patient presents with  . Abdominal Pain  . Migraine   (Consider location/radiation/quality/duration/timing/severity/associated sxs/prior Treatment) HPI Comments: Patient is a 39 one year-old G2 P36 female who presents for nausea, vomiting, and diarrhea as well as low back pain with onset 3 AM this morning. Patient states the pain has been constant since onset it is nonradiating without any aggravating or alleviating factors. Patient endorses multiple episodes of nonbloody, nonbilious emesis the last of which was 2 hours ago. Patient also with watery, nonbloody diarrhea. She endorses associated headache and denies anterior abdominal pain as well as vaginal discharge or bleeding. Patient further denies fevers, chest pain, SOB, melena, hematochezia, dysuria, hematuria, and urinary frequency. LMP 7/18 - endorses positive home pregnancy test. Denies prenatal care for this pregnancy.  Patient is a 22 y.o. female presenting with abdominal pain and migraines. The history is provided by the patient. No language interpreter was used.  Abdominal Pain Associated symptoms: diarrhea, nausea and vomiting   Associated symptoms: no dysuria, no hematuria, no vaginal bleeding and no vaginal discharge   Migraine Associated symptoms include abdominal pain, headaches, nausea and vomiting.    Past Medical History  Diagnosis Date  . Herpes simplex    Past Surgical History  Procedure Laterality Date  . Dilation and curettage of uterus     Family History  Problem Relation Age of Onset  . Diabetes Mother   . Diabetes Father    History  Substance Use Topics  . Smoking status: Former Games developer  . Smokeless tobacco: Not on file  . Alcohol Use: No   OB History   Grav Para Term Preterm Abortions TAB SAB Ect Mult Living   2 1 1  1     1      Review of Systems   Gastrointestinal: Positive for nausea, vomiting, abdominal pain and diarrhea.  Genitourinary: Negative for dysuria, hematuria, vaginal bleeding and vaginal discharge.  Musculoskeletal: Positive for back pain.  Neurological: Positive for headaches.  All other systems reviewed and are negative.    Allergies  Review of patient's allergies indicates no known allergies.  Home Medications   Current Outpatient Rx  Name  Route  Sig  Dispense  Refill  . naproxen sodium (ANAPROX) 220 MG tablet   Oral   Take 220 mg by mouth 2 (two) times daily with a meal.         . ondansetron (ZOFRAN) 4 MG tablet   Oral   Take 1 tablet (4 mg total) by mouth every 6 (six) hours.   12 tablet   0    BP 122/67  Pulse 92  Temp(Src) 98 F (36.7 C) (Oral)  Resp 18  Ht 5\' 4"  (1.626 m)  Wt 217 lb 3 oz (98.516 kg)  BMI 37.26 kg/m2  SpO2 100%  LMP 04/24/2013  Physical Exam  Nursing note and vitals reviewed. Constitutional: She is oriented to person, place, and time. She appears well-developed and well-nourished. No distress.  HENT:  Head: Normocephalic and atraumatic.  Eyes: Conjunctivae and EOM are normal. Pupils are equal, round, and reactive to light. No scleral icterus.  Neck: Normal range of motion.  Cardiovascular: Normal rate, regular rhythm and normal heart sounds.   Pulmonary/Chest: Effort normal. No respiratory distress. She has no wheezes. She has no rales.  Abdominal: Soft. She exhibits no mass.  There is no tenderness. There is no rebound and no guarding.  No peritoneal signs or guarding.  Genitourinary: There is no rash, tenderness, lesion or injury on the right labia. There is no rash, tenderness, lesion or injury on the left labia. Uterus is enlarged. Uterus is not tender. Cervix exhibits no motion tenderness, no discharge and no friability. Right adnexum displays no mass, no tenderness and no fullness. Left adnexum displays no mass, no tenderness and no fullness. No erythema or  tenderness around the vagina. No signs of injury around the vagina. Vaginal discharge (small amount of creamy d/c) found.  Musculoskeletal: Normal range of motion.  +TTP of b/l flank and lumbar paraspinal muscles.  Neurological: She is alert and oriented to person, place, and time.  Skin: Skin is warm and dry. No rash noted. She is not diaphoretic. No erythema. No pallor.  Psychiatric: She has a normal mood and affect. Her behavior is normal.    ED Course  Procedures (including critical care time) Labs Review Labs Reviewed  CBC WITH DIFFERENTIAL - Abnormal; Notable for the following:    Hemoglobin 11.5 (*)    HCT 34.1 (*)    All other components within normal limits  COMPREHENSIVE METABOLIC PANEL - Abnormal; Notable for the following:    BUN 5 (*)    Total Bilirubin 0.2 (*)    All other components within normal limits  URINALYSIS, ROUTINE W REFLEX MICROSCOPIC - Abnormal; Notable for the following:    APPearance HAZY (*)    Leukocytes, UA SMALL (*)    All other components within normal limits  URINE MICROSCOPIC-ADD ON - Abnormal; Notable for the following:    Squamous Epithelial / LPF MANY (*)    Bacteria, UA FEW (*)    All other components within normal limits  POCT PREGNANCY, URINE - Abnormal; Notable for the following:    Preg Test, Ur POSITIVE (*)    All other components within normal limits  URINE CULTURE  WET PREP, GENITAL  GC/CHLAMYDIA PROBE AMP  LIPASE, BLOOD   Imaging Review Bedside U/S done by Dr. Jodi Mourning significant for intrauterine gestation of approximately [redacted]w[redacted]d with active heartbeat of 145  MDM   1. Nausea vomiting and diarrhea   2. Back pain in pregnancy, first trimester   3. Intrauterine pregnancy     22 year old pregnant female who presents for of low back pain with associated nausea, vomiting, and diarrhea. Patient well and nontoxic appearing, hemodynamically stable, and afebrile. Physical exam findings as above. Ultrasound at bedside significant for  intrauterine gestation of approximately 9 weeks 6 days with heartbeat of 145. No free fluid appreciated in the pelvis. Labs unremarkable and consistent with priors. Urinalysis nonsuggestive of infection; few bacteria and 3-6 WBCs with negative nitrites. IVF ordered as well as Reglan and tylenol for symptoms. Will reassess.  Patient with improvement of symptoms with ED management; headache resolved with Reglan. Patient appropriate for d/c with OBGYN follow up. Rx for zofran given and patient agreeable to plan with no unaddressed concerns.  Antony Madura, PA-C 07/02/13 1557  Medical screening examination/treatment/procedure(s) were conducted as a shared visit with non-physician practitioner(s) or resident and myself. I personally evaluated the patient during the encounter and agree with the findings and plan unless otherwise indicated.  Nausea, back pain and HA. Gradual onset ha, normal neuro exam. New pregnancy. Bedside US showed live intrauterine preg, 9 wks. .Abd soft/ nt/ nd. Fluids given. Close fup with OB. Fluids and nausea meds.  EMERGENCY DEPARTMENT [place] PREGNANCY  "  Study: Limited Ultrasound of the Pelvis for Pregnancy"  INDICATIONS:Pregnancy(required) back pain  Multiple views of the uterus and pelvic cavity were obtained in real-time with a multi-frequency probe.  APPROACH:Transabdominal  PERFORMED BY: Myself  IMAGES ARCHIVED?: Yes  LIMITATIONS: Body habitus  PREGNANCY FREE FLUID: None  PREGNANCY FINDINGS: Intrauterine gestational sac noted, Fetal pole present and Fetal heart activity seen  INTERPRETATION: Viable intrauterine pregnancy  GESTATIONAL AGE, ESTIMATE: 9 wk 4 days  FETAL HEART RATE: 150      Enid Skeens, MD 07/02/13 1601

## 2013-07-03 LAB — URINE CULTURE: Colony Count: 50000

## 2013-07-03 LAB — GC/CHLAMYDIA PROBE AMP
CT Probe RNA: NEGATIVE
GC Probe RNA: NEGATIVE

## 2013-07-07 ENCOUNTER — Inpatient Hospital Stay (HOSPITAL_COMMUNITY)
Admission: AD | Admit: 2013-07-07 | Discharge: 2013-07-07 | Disposition: A | Discharge status: Home / Self Care | Payer: Medicaid Other | Source: Ambulatory Visit | Attending: Obstetrics and Gynecology | Admitting: Obstetrics and Gynecology

## 2013-07-07 ENCOUNTER — Encounter (HOSPITAL_COMMUNITY): Payer: Self-pay

## 2013-07-07 DIAGNOSIS — R109 Unspecified abdominal pain: Secondary | ICD-10-CM | POA: Insufficient documentation

## 2013-07-07 DIAGNOSIS — O219 Vomiting of pregnancy, unspecified: Secondary | ICD-10-CM

## 2013-07-07 DIAGNOSIS — M549 Dorsalgia, unspecified: Secondary | ICD-10-CM | POA: Insufficient documentation

## 2013-07-07 DIAGNOSIS — O210 Mild hyperemesis gravidarum: Secondary | ICD-10-CM | POA: Insufficient documentation

## 2013-07-07 HISTORY — DX: Sickle-cell trait: D57.3

## 2013-07-07 HISTORY — DX: Urinary tract infection, site not specified: N39.0

## 2013-07-07 LAB — URINALYSIS, ROUTINE W REFLEX MICROSCOPIC
Ketones, ur: 15 mg/dL — AB
Leukocytes, UA: NEGATIVE
Nitrite: NEGATIVE
pH: 6.5 (ref 5.0–8.0)

## 2013-07-07 LAB — CBC
Hemoglobin: 11.1 g/dL — ABNORMAL LOW (ref 12.0–15.0)
MCHC: 34 g/dL (ref 30.0–36.0)
RDW: 12.2 % (ref 11.5–15.5)
WBC: 6.5 10*3/uL (ref 4.0–10.5)

## 2013-07-07 LAB — COMPREHENSIVE METABOLIC PANEL
ALT: 10 U/L (ref 0–35)
Albumin: 3.5 g/dL (ref 3.5–5.2)
Alkaline Phosphatase: 50 U/L (ref 39–117)
BUN: 7 mg/dL (ref 6–23)
CO2: 24 mEq/L (ref 19–32)
Chloride: 101 mEq/L (ref 96–112)
GFR calc non Af Amer: >90 mL/min (ref >90)
Glucose, Bld: 119 mg/dL — ABNORMAL HIGH (ref 70–99)
Potassium: 3.5 mEq/L (ref 3.5–5.1)
Sodium: 134 mEq/L — ABNORMAL LOW (ref 135–145)
Total Bilirubin: 0.3 mg/dL (ref 0.3–1.2)
Total Protein: 6.7 g/dL (ref 6.0–8.3)

## 2013-07-07 MED ORDER — ONDANSETRON HCL 4 MG/2ML IJ SOLN
4.0000 mg | INTRAMUSCULAR | Status: AC
  Administered 2013-07-07: 4 mg via INTRAVENOUS
  Filled 2013-07-07: qty 2

## 2013-07-07 MED ORDER — FAMOTIDINE IN NACL 20-0.9 MG/50ML-% IV SOLN
20.0000 mg | Freq: Once | INTRAVENOUS | Status: AC
  Administered 2013-07-07: 20 mg via INTRAVENOUS
  Filled 2013-07-07: qty 50

## 2013-07-07 MED ORDER — PROMETHAZINE HCL 25 MG/ML IJ SOLN
25.0000 mg | Freq: Once | INTRAMUSCULAR | Status: AC
  Administered 2013-07-07: 25 mg via INTRAVENOUS
  Filled 2013-07-07: qty 1

## 2013-07-07 MED ORDER — PROMETHAZINE HCL 25 MG RE SUPP: 25.0000 mg | Freq: Four times a day (QID) | RECTAL | Status: DC | PRN

## 2013-07-07 MED ORDER — PROMETHAZINE HCL 25 MG PO TABS: 12.5000 mg | ORAL_TABLET | Freq: Four times a day (QID) | ORAL | Status: DC | PRN

## 2013-07-07 NOTE — MAU Note (Signed)
Patient states she has had vomiting since 9-25, was seen and hydrated at [facility] ED. States she was given Zofran but it is not working. States she has a headache and last night had a fever of 102. No fever today. States she is unable to keep anything down. Back and abdominal pain.

## 2013-07-07 NOTE — MAU Provider Note (Signed)
Chief Complaint: Emesis During Pregnancy, Diarrhea and Headache   First Provider Initiated Contact with Patient 07/07/13 1314     SUBJECTIVE HPI: Kara Myers is a 22 y.o. G3P1011 at [redacted]w[redacted]d by LMP who presents to maternity admissions reporting nausea, vomiting, and back pain.  She reports she has had nausea during the pregnancy but this has been worse in the last 2 days.  She reports her symptoms feel like the kidney infection she had after her daughter was born 7 years ago. Pt was seen for similar symptoms at [facility] ED on 07/02/13 and had bedside sono indicating IUP, cardiac activity, and dates consistent with LMP. She was prescribed Zofran, which she reports is not helping her nausea.  She denies LOF, vaginal bleeding, vaginal itching/burning, urinary symptoms, h/a, dizziness, or fever/chills.     Past Medical History  Diagnosis Date  . Herpes simplex   . Urinary tract infection   . Sickle cell trait    Past Surgical History  Procedure Laterality Date  . Dilation and curettage of uterus     History   Social History  . Marital Status: Single    Spouse Name: N/A    Number of Children: N/A  . Years of Education: N/A   Occupational History  . Not on file.   Social History Main Topics  . Smoking status: Former Games developer  . Smokeless tobacco: Not on file  . Alcohol Use: No  . Drug Use: No  . Sexual Activity: Yes    Birth Control/ Protection: None     Comment: missed last dose of depo   Other Topics Concern  . Not on file   Social History Narrative  . No narrative on file   No current facility-administered medications on file prior to encounter.   Current Outpatient Prescriptions on File Prior to Encounter  Medication Sig Dispense Refill  . ondansetron (ZOFRAN) 4 MG tablet Take 1 tablet (4 mg total) by mouth every 6 (six) hours.  12 tablet  0   No Known Allergies  ROS: Pertinent items in HPI  OBJECTIVE Blood pressure 130/71, pulse 75, temperature 98.3 F (36.8 C),  temperature source Oral, resp. rate 16, height 5' 3.25" (1.607 m), weight 97.342 kg (214 lb 9.6 oz), last menstrual period 04/24/2013, SpO2 98.00%. GENERAL: Well-developed, well-nourished female in mild distress.  HEENT: Normocephalic HEART: normal rate RESP: normal effort ABDOMEN: Soft, non-tender BACK: Negative CVA tenderness EXTREMITIES: Nontender, no edema NEURO: Alert and oriented SPECULUM EXAM: deferred--done at [facility] on 9/25 with cultures sent  LAB RESULTS Results for orders placed during the hospital encounter of 07/07/13 (from the past 24 hour(s))  URINALYSIS, ROUTINE W REFLEX MICROSCOPIC     Status: Abnormal   Collection Time    07/07/13 12:00 PM      Result Value Range   Color, Urine YELLOW  YELLOW   APPearance CLEAR  CLEAR   Specific Gravity, Urine 1.025  1.005 - 1.030   pH 6.5  5.0 - 8.0   Glucose, UA NEGATIVE  NEGATIVE mg/dL   Hgb urine dipstick NEGATIVE  NEGATIVE   Bilirubin Urine NEGATIVE  NEGATIVE   Ketones, ur 15 (*) NEGATIVE mg/dL   Protein, ur NEGATIVE  NEGATIVE mg/dL   Urobilinogen, UA 0.2  0.0 - 1.0 mg/dL   Nitrite NEGATIVE  NEGATIVE   Leukocytes, UA NEGATIVE  NEGATIVE  CBC     Status: Abnormal   Collection Time    07/07/13  1:43 PM      Result Value  Range   WBC 6.5  4.0 - 10.5 K/uL   RBC 3.92  3.87 - 5.11 MIL/uL   Hemoglobin 11.1 (*) 12.0 - 15.0 g/dL   HCT 16.1 (*) 09.6 - 04.5 %   MCV 83.2  78.0 - 100.0 fL   MCH 28.3  26.0 - 34.0 pg   MCHC 34.0  30.0 - 36.0 g/dL   RDW 40.9  81.1 - 91.4 %   Platelets 287  150 - 400 K/uL  COMPREHENSIVE METABOLIC PANEL     Status: Abnormal   Collection Time    07/07/13  1:43 PM      Result Value Range   Sodium 134 (*) 135 - 145 mEq/L   Potassium 3.5  3.5 - 5.1 mEq/L   Chloride 101  96 - 112 mEq/L   CO2 24  19 - 32 mEq/L   Glucose, Bld 119 (*) 70 - 99 mg/dL   BUN 7  6 - 23 mg/dL   Creatinine, Ser 7.82  0.50 - 1.10 mg/dL   Calcium 9.4  8.4 - 95.6 mg/dL   Total Protein 6.7  6.0 - 8.3 g/dL   Albumin 3.5  3.5 - 5.2  g/dL   AST 13  0 - 37 U/L   ALT 10  0 - 35 U/L   Alkaline Phosphatase 50  39 - 117 U/L   Total Bilirubin 0.3  0.3 - 1.2 mg/dL   GFR calc non Af Amer >90  >90 mL/min   GFR calc Af Amer >90  >90 mL/min    ASSESSMENT 1. Nausea/vomiting in pregnancy     PLAN Phenergan 25 mg in D5LR x1000 ml, Pepcid 20 mg IV, Zofran 4 mg IV in MAU Discharge home Phenergan 12.5-25 mg PO Q 6 hours Phenergan 25 mg suppositories PRN when unable to keep down PO Keep taking Zofran as prescribed F/U at [facility] as planned Return to MAU as needed    Medication List    ASK your doctor about these medications       flintstones complete 60 MG chewable tablet  Chew 1 tablet by mouth daily.     ondansetron 4 MG tablet  Commonly known as:  ZOFRAN  Take 1 tablet (4 mg total) by mouth every 6 (six) hours.         Sharen Counter Certified Nurse-Midwife 07/07/2013  5:16 PM

## 2013-07-08 NOTE — MAU Provider Note (Signed)
Attestation of Attending Supervision of Advanced Practitioner (CNM/NP): Evaluation and management procedures were performed by the Advanced Practitioner under my supervision and collaboration.  I have reviewed the Advanced Practitioner's note and chart, and I agree with the management and plan.  [name] 07/08/2013 8:35 AM

## 2013-09-08 ENCOUNTER — Encounter (HOSPITAL_COMMUNITY): Payer: Self-pay

## 2013-09-08 ENCOUNTER — Inpatient Hospital Stay (HOSPITAL_COMMUNITY)
Admission: AD | Admit: 2013-09-08 | Discharge: 2013-09-08 | Disposition: A | Discharge status: Home / Self Care | Payer: Medicaid Other | Source: Ambulatory Visit | Attending: Obstetrics & Gynecology | Admitting: Obstetrics & Gynecology

## 2013-09-08 DIAGNOSIS — O99891 Other specified diseases and conditions complicating pregnancy: Secondary | ICD-10-CM | POA: Insufficient documentation

## 2013-09-08 DIAGNOSIS — N949 Unspecified condition associated with female genital organs and menstrual cycle: Secondary | ICD-10-CM

## 2013-09-08 DIAGNOSIS — IMO0002 Reserved for concepts with insufficient information to code with codable children: Secondary | ICD-10-CM | POA: Insufficient documentation

## 2013-09-08 DIAGNOSIS — R109 Unspecified abdominal pain: Secondary | ICD-10-CM | POA: Insufficient documentation

## 2013-09-08 LAB — WET PREP, GENITAL

## 2013-09-08 NOTE — MAU Provider Note (Signed)
Attestation of Attending Supervision of Advanced Practitioner (PA/CNM/NP): Evaluation and management procedures were performed by the Advanced Practitioner under my supervision and collaboration.  I have reviewed the Advanced Practitioner's note and chart, and I agree with the management and plan.  Allen Basista, MD, FACOG Attending Obstetrician & Gynecologist Faculty Practice, [facility]  

## 2013-09-08 NOTE — MAU Provider Note (Signed)
History     CSN: [id number]  Arrival date and time: 09/08/13 0532   First Provider Initiated Contact with Patient 09/08/13 0602      Chief Complaint  Patient presents with  . Spasms  . Leg Swelling   HPI  Kara Myers is a 22 y.o. G3P1011 at [redacted]w[redacted]d who presents today because her feet are swollen. She also states that yesterday around 1700 her arm was shaking. It has since stopped. She states that she has not felt the baby moved yet. She denies any abdominal pain or VB. She does report a white/clear vaginal discharge. She denies any itching or funny odor. She also reports pain on both sides of the abdomen. She is planning on going to [facility] for prenatal care.    Past Medical History  Diagnosis Date  . Herpes simplex     last outbreak in 2010  . Urinary tract infection   . Sickle cell trait     Past Surgical History  Procedure Laterality Date  . Dilation and curettage of uterus      Family History  Problem Relation Age of Onset  . Diabetes Mother   . Diabetes Father     History  Substance Use Topics  . Smoking status: Former Games developer  . Smokeless tobacco: Not on file  . Alcohol Use: No    Allergies: No Known Allergies  No prescriptions prior to admission    ROS Physical Exam   Blood pressure 114/65, pulse 53, temperature 98.8 F (37.1 C), temperature source Oral, resp. rate 18, height 5\' 4"  (1.626 m), weight 97.251 kg (214 lb 6.4 oz), last menstrual period 04/24/2013.  Physical Exam  Nursing note and vitals reviewed. Constitutional: She is oriented to person, place, and time. She appears well-developed and well-nourished. No distress.  Cardiovascular: Normal rate.   Respiratory: Effort normal.  GI: Soft. There is no tenderness.  Genitourinary:  FHT 140 wit doppler Cervix: closed/thick/high   Neurological: She is oriented to person, place, and time.  Skin: Skin is warm and dry.  Psychiatric: She has a normal mood and affect.    MAU Course   Procedures  Results for orders placed during the hospital encounter of 09/08/13 (from the past 24 hour(s))  WET PREP, GENITAL     Status: Abnormal   Collection Time    09/08/13  6:14 AM      Result Value Range   Yeast Wet Prep HPF POC NONE SEEN  NONE SEEN   Trich, Wet Prep NONE SEEN  NONE SEEN   Clue Cells Wet Prep HPF POC NONE SEEN  NONE SEEN   WBC, Wet Prep HPF POC FEW (*) NONE SEEN     Assessment and Plan   1. Round ligament pain        Follow-up Information   Schedule an appointment as soon as possible for a visit with [facility].   Specialty:  Obstetrics and Gynecology   Contact information:   [address] [place] [address] [phone]       Medication List         flintstones complete 60 MG chewable tablet  Chew 1 tablet by mouth daily.     ondansetron 4 MG tablet  Commonly known as:  ZOFRAN  Take 1 tablet (4 mg total) by mouth every 6 (six) hours.     promethazine 25 MG tablet  Commonly known as:  PHENERGAN  Take 0.5-1 tablets (12.5-25 mg total) by mouth every  6 (six) hours as needed for nausea.     promethazine 25 MG suppository  Commonly known as:  PHENERGAN  Place 1 suppository (25 mg total) rectally every 6 (six) hours as needed for nausea.       Second trimester danger signs reviewed Return to MAU as needed  Tawnya Crook 09/08/2013, 7:23 AM

## 2013-09-08 NOTE — MAU Note (Signed)
Muscle spasm in left arm yesterday. Bilateral feet swelling with right ankle pain since this morning. Denies abdominal pain/VB/LOF. Vaginal discharge clear.

## 2013-10-08 NOTE — L&D Delivery Note (Signed)
Attestation of Attending Supervision of Obstetric Fellow: Evaluation and management procedures were performed by the Obstetric Fellow under my supervision and collaboration.  I have reviewed the Obstetric Fellow's note and chart, and I agree with the management and plan.  Avery Klingbeil, MD, FACOG Attending Obstetrician & Gynecologist Faculty Practice, [facility]   

## 2013-10-08 NOTE — L&D Delivery Note (Signed)
Delivery Note At 1:23 AM a viable female was delivered via Vaginal, Spontaneous Delivery (Presentation: Right Occiput Anterior).  APGAR: 8, 9; weight .   Placenta status: Intact, Spontaneous.  Cord: 3 vessels with the following complications: None.  Cord pH: not sent  Anesthesia: Epidural  Episiotomy: None Lacerations: None Suture Repair: NA Est. Blood Loss (mL): 250  Mom to postpartum.  Baby to Couplet care / Skin to Skin.  Called to room for decel. Pt was complete. Improved deceleration and pushed over intact perineum with 3 contractions delivered crying infant to maternal abdomen. Traction delivered intact placenta followed by pit. EBL 250. Counts correct. Hemostatic.  Kara BalsamMichael R Hatsumi Myers 01/23/2014, 1:39 AM

## 2013-10-15 ENCOUNTER — Other Ambulatory Visit (HOSPITAL_COMMUNITY): Payer: Self-pay | Admitting: Nurse Practitioner

## 2013-10-15 DIAGNOSIS — Z3689 Encounter for other specified antenatal screening: Secondary | ICD-10-CM

## 2013-10-16 ENCOUNTER — Ambulatory Visit (HOSPITAL_COMMUNITY)
Admission: RE | Admit: 2013-10-16 | Discharge: 2013-10-16 | Disposition: A | Discharge status: Home / Self Care | Payer: Medicaid Other | Source: Ambulatory Visit | Attending: Nurse Practitioner | Admitting: Nurse Practitioner

## 2013-10-16 DIAGNOSIS — Z3689 Encounter for other specified antenatal screening: Secondary | ICD-10-CM | POA: Insufficient documentation

## 2013-11-26 ENCOUNTER — Other Ambulatory Visit (HOSPITAL_COMMUNITY): Payer: Self-pay | Admitting: Physician Assistant

## 2013-11-26 DIAGNOSIS — O3660X0 Maternal care for excessive fetal growth, unspecified trimester, not applicable or unspecified: Secondary | ICD-10-CM

## 2013-12-01 ENCOUNTER — Ambulatory Visit (HOSPITAL_COMMUNITY)
Admission: RE | Admit: 2013-12-01 | Discharge: 2013-12-01 | Disposition: A | Discharge status: Home / Self Care | Payer: Medicaid Other | Source: Ambulatory Visit | Attending: Physician Assistant | Admitting: Physician Assistant

## 2013-12-01 DIAGNOSIS — O3660X0 Maternal care for excessive fetal growth, unspecified trimester, not applicable or unspecified: Secondary | ICD-10-CM | POA: Insufficient documentation

## 2013-12-01 DIAGNOSIS — Z3689 Encounter for other specified antenatal screening: Secondary | ICD-10-CM | POA: Insufficient documentation

## 2014-01-10 ENCOUNTER — Encounter (HOSPITAL_COMMUNITY): Payer: Self-pay | Admitting: *Deleted

## 2014-01-10 ENCOUNTER — Inpatient Hospital Stay (HOSPITAL_COMMUNITY)
Admission: AD | Admit: 2014-01-10 | Discharge: 2014-01-10 | Disposition: A | Discharge status: Home / Self Care | Payer: Medicaid Other | Source: Ambulatory Visit | Attending: Obstetrics and Gynecology | Admitting: Obstetrics and Gynecology

## 2014-01-10 DIAGNOSIS — Z87891 Personal history of nicotine dependence: Secondary | ICD-10-CM

## 2014-01-10 DIAGNOSIS — Z0371 Encounter for suspected problem with amniotic cavity and membrane ruled out: Secondary | ICD-10-CM

## 2014-01-10 DIAGNOSIS — O99019 Anemia complicating pregnancy, unspecified trimester: Secondary | ICD-10-CM

## 2014-01-10 DIAGNOSIS — O99891 Other specified diseases and conditions complicating pregnancy: Secondary | ICD-10-CM

## 2014-01-10 DIAGNOSIS — O9989 Other specified diseases and conditions complicating pregnancy, childbirth and the puerperium: Principal | ICD-10-CM

## 2014-01-10 DIAGNOSIS — D573 Sickle-cell trait: Secondary | ICD-10-CM | POA: Insufficient documentation

## 2014-01-10 LAB — AMNISURE RUPTURE OF MEMBRANE (ROM) NOT AT ARMC: AMNISURE: NEGATIVE

## 2014-01-10 NOTE — MAU Provider Note (Signed)
  History     CSN: [id number]  Arrival date and time: 01/10/14 [id number]   First Provider Initiated Contact with Patient 01/10/14 1056      Chief Complaint  Patient presents with  . Rupture of Membranes   HPI  Kara Myers is a 23 y.o. G3P1011 at 2416w2d who presents today with leaking of fluid. She states that she had some watery discharge last night, and has some some more today. She has not needed to use a pad. She denies any bleeding or contractions, and confirms fetal movement.   Past Medical History  Diagnosis Date  . Herpes simplex     last outbreak in 2010  . Urinary tract infection   . Sickle cell trait     Past Surgical History  Procedure Laterality Date  . Dilation and curettage of uterus      Family History  Problem Relation Age of Onset  . Diabetes Mother   . Diabetes Father     History  Substance Use Topics  . Smoking status: Former Games developermoker  . Smokeless tobacco: Never Used  . Alcohol Use: No    Allergies: No Known Allergies  Prescriptions prior to admission  Medication Sig Dispense Refill  . Prenatal Vit-Fe Fumarate-FA (PRENATAL MULTIVITAMIN) TABS tablet Take 1 tablet by mouth daily at 12 noon.        ROS Physical Exam   Blood pressure 113/59, pulse 86, temperature 98 F (36.7 C), temperature source Oral, resp. rate 16, height 5\' 4"  (1.626 m), weight 105.008 kg (231 lb 8 oz), last menstrual period 04/24/2013.  Physical Exam  Nursing note and vitals reviewed. Constitutional: She is oriented to person, place, and time. She appears well-developed and well-nourished. No distress.  Cardiovascular: Normal rate.   Respiratory: Effort normal.  GI: Soft. There is no tenderness.  Genitourinary:   External: no lesion Vagina: moderate amount of white, watery discharge Cervix: pink, smooth, FT/thick/high Uterus: AGA   Neurological: She is alert and oriented to person, place, and time.  Skin: Skin is warm and dry.  Psychiatric: She has a normal mood  and affect.   FHT: Cat I, 135, moderate with 15x15 accels, no decels Toco, no UCs   MAU Course  Procedures  Fern: negative. Will get amnisure due to the watery nature of the discharge.   Results for orders placed during the hospital encounter of 01/10/14 (from the past 24 hour(s))  AMNISURE RUPTURE OF MEMBRANE (ROM)     Status: None   Collection Time    01/10/14 11:15 AM      Result Value Ref Range   Amnisure ROM NEGATIVE       Assessment and Plan   1. Encounter for suspected PROM, with rupture of membranes not found    Labor precautions Fetal kick counts Return to MAU as needed  Follow-up Information   [facility]. (As scheduled)    Contact information:   [address] [place] [id number] [phone]       Tawnya CrookHogan, Heather Donovan 01/10/2014, 11:56 AM

## 2014-01-10 NOTE — Discharge Instructions (Signed)
Third Trimester of Pregnancy  The third trimester is from week 29 through week 42, months 7 through 9. The third trimester is a time when the fetus is growing rapidly. At the end of the ninth month, the fetus is about 20 inches in length and weighs 6 10 pounds.   BODY CHANGES  Your body goes through many changes during pregnancy. The changes vary from woman to woman.    Your weight will continue to increase. You can expect to gain 25 35 pounds (11 16 kg) by the end of the pregnancy.   You may begin to get stretch marks on your hips, abdomen, and breasts.   You may urinate more often because the fetus is moving lower into your pelvis and pressing on your bladder.   You may develop or continue to have heartburn as a result of your pregnancy.   You may develop constipation because certain hormones are causing the muscles that push waste through your intestines to slow down.   You may develop hemorrhoids or swollen, bulging veins (varicose veins).   You may have pelvic pain because of the weight gain and pregnancy hormones relaxing your joints between the bones in your pelvis. Back aches may result from over exertion of the muscles supporting your posture.   Your breasts will continue to grow and be tender. A yellow discharge may leak from your breasts called colostrum.   Your belly button may stick out.   You may feel short of breath because of your expanding uterus.   You may notice the fetus "dropping," or moving lower in your abdomen.   You may have a bloody mucus discharge. This usually occurs a few days to a week before labor begins.   Your cervix becomes thin and soft (effaced) near your due date.  WHAT TO EXPECT AT YOUR PRENATAL EXAMS   You will have prenatal exams every 2 weeks until week 36. Then, you will have weekly prenatal exams. During a routine prenatal visit:   You will be weighed to make sure you and the fetus are growing normally.   Your blood pressure is taken.   Your abdomen will be  measured to track your baby's growth.   The fetal heartbeat will be listened to.   Any test results from the previous visit will be discussed.   You may have a cervical check near your due date to see if you have effaced.  At around 36 weeks, your caregiver will check your cervix. At the same time, your caregiver will also perform a test on the secretions of the vaginal tissue. This test is to determine if a type of bacteria, Group B streptococcus, is present. Your caregiver will explain this further.  Your caregiver may ask you:   What your birth plan is.   How you are feeling.   If you are feeling the baby move.   If you have had any abnormal symptoms, such as leaking fluid, bleeding, severe headaches, or abdominal cramping.   If you have any questions.  Other tests or screenings that may be performed during your third trimester include:   Blood tests that check for low iron levels (anemia).   Fetal testing to check the health, activity level, and growth of the fetus. Testing is done if you have certain medical conditions or if there are problems during the pregnancy.  FALSE LABOR  You may feel small, irregular contractions that eventually go away. These are called Braxton Hicks contractions, or   false labor. Contractions may last for hours, days, or even weeks before true labor sets in. If contractions come at regular intervals, intensify, or become painful, it is best to be seen by your caregiver.   SIGNS OF LABOR    Menstrual-like cramps.   Contractions that are 5 minutes apart or less.   Contractions that start on the top of the uterus and spread down to the lower abdomen and back.   A sense of increased pelvic pressure or back pain.   A watery or bloody mucus discharge that comes from the vagina.  If you have any of these signs before the 37th week of pregnancy, call your caregiver right away. You need to go to the hospital to get checked immediately.  HOME CARE INSTRUCTIONS    Avoid all  smoking, herbs, alcohol, and unprescribed drugs. These chemicals affect the formation and growth of the baby.   Follow your caregiver's instructions regarding medicine use. There are medicines that are either safe or unsafe to take during pregnancy.   Exercise only as directed by your caregiver. Experiencing uterine cramps is a good sign to stop exercising.   Continue to eat regular, healthy meals.   Wear a good support bra for breast tenderness.   Do not use hot tubs, steam rooms, or saunas.   Wear your seat belt at all times when driving.   Avoid raw meat, uncooked cheese, cat litter boxes, and soil used by cats. These carry germs that can cause birth defects in the baby.   Take your prenatal vitamins.   Try taking a stool softener (if your caregiver approves) if you develop constipation. Eat more high-fiber foods, such as fresh vegetables or fruit and whole grains. Drink plenty of fluids to keep your urine clear or pale yellow.   Take warm sitz baths to soothe any pain or discomfort caused by hemorrhoids. Use hemorrhoid cream if your caregiver approves.   If you develop varicose veins, wear support hose. Elevate your feet for 15 minutes, 3 4 times a day. Limit salt in your diet.   Avoid heavy lifting, wear low heal shoes, and practice good posture.   Rest a lot with your legs elevated if you have leg cramps or low back pain.   Visit your dentist if you have not gone during your pregnancy. Use a soft toothbrush to brush your teeth and be gentle when you floss.   A sexual relationship may be continued unless your caregiver directs you otherwise.   Do not travel far distances unless it is absolutely necessary and only with the approval of your caregiver.   Take prenatal classes to understand, practice, and ask questions about the labor and delivery.   Make a trial run to the hospital.   Pack your hospital bag.   Prepare the baby's nursery.   Continue to go to all your prenatal visits as directed  by your caregiver.  SEEK MEDICAL CARE IF:   You are unsure if you are in labor or if your water has broken.   You have dizziness.   You have mild pelvic cramps, pelvic pressure, or nagging pain in your abdominal area.   You have persistent nausea, vomiting, or diarrhea.   You have a bad smelling vaginal discharge.   You have pain with urination.  SEEK IMMEDIATE MEDICAL CARE IF:    You have a fever.   You are leaking fluid from your vagina.   You have spotting or bleeding from your vagina.     You have severe abdominal cramping or pain.   You have rapid weight loss or gain.   You have shortness of breath with chest pain.   You notice sudden or extreme swelling of your face, hands, ankles, feet, or legs.   You have not felt your baby move in over an hour.   You have severe headaches that do not go away with medicine.   You have vision changes.  Document Released: 09/18/2001 Document Revised: 05/27/2013 Document Reviewed: 11/25/2012  ExitCare Patient Information 2014 [employer].

## 2014-01-10 NOTE — MAU Note (Signed)
Patient presents with complaint that her pants got wet last night but has seen no further leakage.

## 2014-01-15 NOTE — MAU Provider Note (Signed)
Attestation of Attending Supervision of Advanced Practitioner: Evaluation and management procedures were performed by the PA/NP/CNM/OB Fellow under my supervision/collaboration. Chart reviewed and agree with management and plan.  Tilda BurrowJohn V Sarely Stracener 01/15/2014 7:32 AM

## 2014-01-18 ENCOUNTER — Inpatient Hospital Stay (HOSPITAL_COMMUNITY)
Admission: AD | Admit: 2014-01-18 | Discharge: 2014-01-18 | Disposition: A | Discharge status: Home / Self Care | Payer: Medicaid Other | Source: Ambulatory Visit | Attending: Obstetrics & Gynecology | Admitting: Obstetrics & Gynecology

## 2014-01-18 ENCOUNTER — Encounter (HOSPITAL_COMMUNITY): Payer: Self-pay | Admitting: *Deleted

## 2014-01-18 DIAGNOSIS — O99891 Other specified diseases and conditions complicating pregnancy: Secondary | ICD-10-CM | POA: Insufficient documentation

## 2014-01-18 DIAGNOSIS — Z87891 Personal history of nicotine dependence: Secondary | ICD-10-CM | POA: Insufficient documentation

## 2014-01-18 DIAGNOSIS — O9989 Other specified diseases and conditions complicating pregnancy, childbirth and the puerperium: Principal | ICD-10-CM

## 2014-01-18 DIAGNOSIS — O99019 Anemia complicating pregnancy, unspecified trimester: Secondary | ICD-10-CM | POA: Insufficient documentation

## 2014-01-18 DIAGNOSIS — D573 Sickle-cell trait: Secondary | ICD-10-CM | POA: Insufficient documentation

## 2014-01-18 DIAGNOSIS — R109 Unspecified abdominal pain: Secondary | ICD-10-CM | POA: Insufficient documentation

## 2014-01-18 LAB — URINALYSIS, ROUTINE W REFLEX MICROSCOPIC
Bilirubin Urine: NEGATIVE
Glucose, UA: NEGATIVE mg/dL
Hgb urine dipstick: NEGATIVE
KETONES UR: NEGATIVE mg/dL
Leukocytes, UA: NEGATIVE
Nitrite: NEGATIVE
PROTEIN: NEGATIVE mg/dL
Specific Gravity, Urine: 1.02 (ref 1.005–1.030)
Urobilinogen, UA: 0.2 mg/dL (ref 0.0–1.0)
pH: 6 (ref 5.0–8.0)

## 2014-01-18 LAB — WET PREP, GENITAL
Clue Cells Wet Prep HPF POC: NONE SEEN
Trich, Wet Prep: NONE SEEN
Yeast Wet Prep HPF POC: NONE SEEN

## 2014-01-18 LAB — POCT FERN TEST: POCT FERN TEST: NEGATIVE

## 2014-01-18 MED ORDER — OXYCODONE-ACETAMINOPHEN 5-325 MG PO TABS: 1.0000 | ORAL_TABLET | Freq: Four times a day (QID) | ORAL | Status: DC | PRN

## 2014-01-18 MED ORDER — CYCLOBENZAPRINE HCL 10 MG PO TABS
10.0000 mg | ORAL_TABLET | Freq: Once | ORAL | Status: AC
  Administered 2014-01-18: 10 mg via ORAL
  Filled 2014-01-18: qty 1

## 2014-01-18 MED ORDER — CYCLOBENZAPRINE HCL 10 MG PO TABS: 10.0000 mg | ORAL_TABLET | Freq: Once | ORAL | Status: DC

## 2014-01-18 NOTE — Discharge Instructions (Signed)
Abdominal Pain During Pregnancy °Abdominal pain is common in pregnancy. Most of the time, it does not cause harm. There are many causes of abdominal pain. Some causes are more serious than others. Some of the causes of abdominal pain in pregnancy are easily diagnosed. Occasionally, the diagnosis takes time to understand. Other times, the cause is not determined. Abdominal pain can be a sign that something is very wrong with the pregnancy, or the pain may have nothing to do with the pregnancy at all. For this reason, always tell your health care provider if you have any abdominal discomfort. °HOME CARE INSTRUCTIONS  °Monitor your abdominal pain for any changes. The following actions may help to alleviate any discomfort you are experiencing: °· Do not have sexual intercourse or put anything in your vagina until your symptoms go away completely. °· Get plenty of rest until your pain improves. °· Drink clear fluids if you feel nauseous. Avoid solid food as long as you are uncomfortable or nauseous. °· Only take over-the-counter or prescription medicine as directed by your health care provider. °· Keep all follow-up appointments with your health care provider. °SEEK IMMEDIATE MEDICAL CARE IF: °· You are bleeding, leaking fluid, or passing tissue from the vagina. °· You have increasing pain or cramping. °· You have persistent vomiting. °· You have painful or bloody urination. °· You have a fever. °· You notice a decrease in your baby's movements. °· You have extreme weakness or feel faint. °· You have shortness of breath, with or without abdominal pain. °· You develop a severe headache with abdominal pain. °· You have abnormal vaginal discharge with abdominal pain. °· You have persistent diarrhea. °· You have abdominal pain that continues even after rest, or gets worse. °MAKE SURE YOU:  °· Understand these instructions. °· Will watch your condition. °· Will get help right away if you are not doing well or get  worse. °Document Released: 09/24/2005 Document Revised: 07/15/2013 Document Reviewed: 04/23/2013 °ExitCare® Patient Information ©2014 [employer]. ° °

## 2014-01-18 NOTE — Progress Notes (Signed)
Written and verbal d/c instructions given and understanding voiced. 

## 2014-01-18 NOTE — MAU Provider Note (Signed)
  History     CSN: [id number]  Arrival date and time: 01/18/14 1035   First Provider Initiated Contact with Patient 01/18/14 1200      Chief Complaint  Patient presents with  . Labor Eval   HPI 23 year old G3P1011 at 6575w3d presents to the MAU with complaints of lower abdominal pain.  Patient reports that she developed bilateral, cramping abdominal pain last night.  It has continued to persist and has become severe, prompting her to come in for evaluation. She denies associated vaginal bleeding.  She does report vaginal discharge and ? Leaking of fluid.  Good fetal movement.  No associated dysuria, fever, chills, nausea, vomiting.  Pain is currently 10/10 in severity.  Patient tearful upon evaluation.  PNC: [facility]; Complications: Patient with history of HTN (gestational), history of Herpes, Late PNC, Marijuana use.  Past Medical History  Diagnosis Date  . Herpes simplex     last outbreak in 2010  . Urinary tract infection   . Sickle cell trait     Past Surgical History  Procedure Laterality Date  . Dilation and curettage of uterus      Family History  Problem Relation Age of Onset  . Diabetes Mother   . Diabetes Father     History  Substance Use Topics  . Smoking status: Former Games developermoker  . Smokeless tobacco: Never Used  . Alcohol Use: No    Allergies: No Known Allergies  Prescriptions prior to admission  Medication Sig Dispense Refill  . Prenatal Vit-Fe Fumarate-FA (PRENATAL MULTIVITAMIN) TABS tablet Take 1 tablet by mouth daily at 12 noon.        ROS Per HPI  Physical Exam   Blood pressure 125/81, pulse 77, temperature 98.4 F (36.9 C), temperature source Oral, resp. rate 20, height 5\' 4"  (1.626 m), weight 106.867 kg (235 lb 9.6 oz), last menstrual period 04/24/2013, SpO2 99.00%.  Physical Exam Gen: appears in mild distress secondary to pain. Heart: RRR. No m/r/g. Lungs: CTAB.  Abd: gravid, soft, tender to palpation in the LLQ and RLQ. No rebound or  guarding.  No RUQ pain.  Ext: no appreciable lower extremity edema bilaterally Neuro: No focal deficits. Pelvic Exam:        External: normal female genitalia without lesions or masses        Vagina: white discharge noted. No pooling or bleeding noted.        Samples for Wet prep, Fern Collected Cervical Exam: Dilation: 1 (ex os.Unable to reach internal os) Cervical Position: Posterior Station: -3 Exam by:: Dr Percell Millerook/Jo Barham [facility]  FHR: baseline 135, mod variability, 15x15 accels, no decels Toco: Uterine irritability  MAU Course  Procedures  MDM Freddie ApleyFern, Wet prep  Assessment and Plan  23 year old G3P1011 at 5975w3d presents to the MAU with complaints of lower abdominal pain.  # Abdominal pain - Cervix - Internal Os closed; Cat 1 tracing with Uterine irritability.  Patient does not appear to be in labor - UA negative - Pain likely MSK in nature - Will treat with Flexeril and D/C home with Flexeril and Percocet  # ?ROM - No pooling noted.  - Fern negative - Wet prep unremarkable  Patient stable for discharge home.  Tommie SamsJayce G Cook 01/18/2014, 12:10 PM   I spoke with and examined patient and agree with resident's note and plan of care.  Tawana ScaleMichael Ryan Elaiza Shoberg, MD OB Fellow 01/18/2014 11:11 PM

## 2014-01-18 NOTE — MAU Note (Signed)
OK to d/c EFM per Dr Adriana Simasook

## 2014-01-18 NOTE — MAU Note (Signed)
Patient states she is having constant lower abdominal pain and pressure. Not sure if having contractions or not. Reports fetal movement but not as much as usual. Denies bleeding or leaking fluid.

## 2014-01-18 NOTE — Progress Notes (Signed)
Dr Cook in to see pt. 

## 2014-01-18 NOTE — Progress Notes (Signed)
Wet prep and fern collected

## 2014-01-18 NOTE — Progress Notes (Signed)
Dr Adriana Simasook notified of pt's admission and status. Aware of c/o of leaking fld on occ. Will see pt

## 2014-01-21 ENCOUNTER — Encounter (HOSPITAL_COMMUNITY): Payer: Self-pay | Admitting: *Deleted

## 2014-01-21 ENCOUNTER — Inpatient Hospital Stay (HOSPITAL_COMMUNITY)
Admission: AD | Admit: 2014-01-21 | Discharge: 2014-01-24 | DRG: 775 | Disposition: A | Discharge status: Home / Self Care | Payer: Medicaid Other | Source: Ambulatory Visit | Attending: Family Medicine | Admitting: Family Medicine

## 2014-01-21 DIAGNOSIS — O9902 Anemia complicating childbirth: Secondary | ICD-10-CM | POA: Diagnosis present

## 2014-01-21 DIAGNOSIS — O98519 Other viral diseases complicating pregnancy, unspecified trimester: Secondary | ICD-10-CM | POA: Diagnosis present

## 2014-01-21 DIAGNOSIS — O411090 Infection of amniotic sac and membranes, unspecified, unspecified trimester, not applicable or unspecified: Secondary | ICD-10-CM | POA: Diagnosis present

## 2014-01-21 DIAGNOSIS — A6000 Herpesviral infection of urogenital system, unspecified: Secondary | ICD-10-CM | POA: Diagnosis present

## 2014-01-21 DIAGNOSIS — D573 Sickle-cell trait: Secondary | ICD-10-CM | POA: Diagnosis present

## 2014-01-21 DIAGNOSIS — Z87891 Personal history of nicotine dependence: Secondary | ICD-10-CM

## 2014-01-21 DIAGNOSIS — O4290 Premature rupture of membranes, unspecified as to length of time between rupture and onset of labor, unspecified weeks of gestation: Principal | ICD-10-CM | POA: Diagnosis present

## 2014-01-21 DIAGNOSIS — Z833 Family history of diabetes mellitus: Secondary | ICD-10-CM

## 2014-01-21 LAB — CBC
HCT: 34.1 % — ABNORMAL LOW (ref 36.0–46.0)
Hemoglobin: 11.6 g/dL — ABNORMAL LOW (ref 12.0–15.0)
MCH: 30 pg (ref 26.0–34.0)
MCHC: 34 g/dL (ref 30.0–36.0)
MCV: 88.1 fL (ref 78.0–100.0)
PLATELETS: 273 10*3/uL (ref 150–400)
RBC: 3.87 MIL/uL (ref 3.87–5.11)
RDW: 12.6 % (ref 11.5–15.5)
WBC: 10.1 10*3/uL (ref 4.0–10.5)

## 2014-01-21 LAB — OB RESULTS CONSOLE RPR: RPR: NONREACTIVE

## 2014-01-21 LAB — TYPE AND SCREEN
ABO/RH(D): O POS
Antibody Screen: NEGATIVE

## 2014-01-21 LAB — OB RESULTS CONSOLE RUBELLA ANTIBODY, IGM: Rubella: IMMUNE

## 2014-01-21 LAB — OB RESULTS CONSOLE GC/CHLAMYDIA
Chlamydia: NEGATIVE
GC PROBE AMP, GENITAL: NEGATIVE

## 2014-01-21 LAB — OB RESULTS CONSOLE ABO/RH: RH Type: POSITIVE

## 2014-01-21 LAB — OB RESULTS CONSOLE HEPATITIS B SURFACE ANTIGEN: Hepatitis B Surface Ag: NEGATIVE

## 2014-01-21 LAB — RPR

## 2014-01-21 LAB — OB RESULTS CONSOLE GBS: STREP GROUP B AG: NEGATIVE

## 2014-01-21 LAB — OB RESULTS CONSOLE HIV ANTIBODY (ROUTINE TESTING): HIV: NONREACTIVE

## 2014-01-21 LAB — AMNISURE RUPTURE OF MEMBRANE (ROM) NOT AT ARMC: Amnisure ROM: POSITIVE

## 2014-01-21 LAB — OB RESULTS CONSOLE ANTIBODY SCREEN: Antibody Screen: NEGATIVE

## 2014-01-21 MED ORDER — ONDANSETRON HCL 4 MG/2ML IJ SOLN: 4.0000 mg | Freq: Four times a day (QID) | INTRAMUSCULAR | Status: DC | PRN

## 2014-01-21 MED ORDER — OXYTOCIN 40 UNITS IN LACTATED RINGERS INFUSION - SIMPLE MED: 62.5000 mL/h | INTRAVENOUS | Status: DC

## 2014-01-21 MED ORDER — MISOPROSTOL 50MCG HALF TABLET
50.0000 ug | ORAL_TABLET | ORAL | Status: DC | PRN
  Administered 2014-01-21 – 2014-01-22 (×3): 50 ug via ORAL
  Filled 2014-01-21 (×3): qty 1

## 2014-01-21 MED ORDER — OXYTOCIN BOLUS FROM INFUSION
500.0000 mL | INTRAVENOUS | Status: DC
  Administered 2014-01-23: 500 mL via INTRAVENOUS

## 2014-01-21 MED ORDER — LACTATED RINGERS IV SOLN
INTRAVENOUS | Status: DC
  Administered 2014-01-22: 125 mL/h via INTRAVENOUS
  Administered 2014-01-22 – 2014-01-23 (×2): via INTRAVENOUS

## 2014-01-21 MED ORDER — LACTATED RINGERS IV SOLN
500.0000 mL | INTRAVENOUS | Status: DC | PRN
  Administered 2014-01-23: 1000 mL via INTRAVENOUS

## 2014-01-21 MED ORDER — CITRIC ACID-SODIUM CITRATE 334-500 MG/5ML PO SOLN
30.0000 mL | ORAL | Status: DC | PRN
  Filled 2014-01-21: qty 15

## 2014-01-21 MED ORDER — OXYCODONE-ACETAMINOPHEN 5-325 MG PO TABS: 1.0000 | ORAL_TABLET | ORAL | Status: DC | PRN

## 2014-01-21 MED ORDER — ACETAMINOPHEN 325 MG PO TABS: 650.0000 mg | ORAL_TABLET | ORAL | Status: DC | PRN

## 2014-01-21 MED ORDER — TERBUTALINE SULFATE 1 MG/ML IJ SOLN: 0.2500 mg | Freq: Once | INTRAMUSCULAR | Status: AC | PRN

## 2014-01-21 MED ORDER — LIDOCAINE HCL (PF) 1 % IJ SOLN
30.0000 mL | INTRAMUSCULAR | Status: DC | PRN
  Filled 2014-01-21: qty 30

## 2014-01-21 MED ORDER — IBUPROFEN 600 MG PO TABS
600.0000 mg | ORAL_TABLET | Freq: Four times a day (QID) | ORAL | Status: DC | PRN
  Administered 2014-01-23: 600 mg via ORAL
  Filled 2014-01-21: qty 1

## 2014-01-21 NOTE — H&P (Signed)
Kara Myers is a 23 y.o. female G3P1011 with IUP at 5982w6d presenting for PROM with unkown time of rupture but possibly 48hrs. Pt states she has been having irregular contractions, associated without  vaginal bleeding.  Membranes are ruptured, clear fluid, with active fetal movement.   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 Herpes simplex     last outbreak in 2010  . Urinary tract infection   . Sickle cell trait     Past Surgical History: Past Surgical History  Procedure Laterality Date  . Dilation and curettage of uterus      Obstetrical History: OB History   Grav Para Term Preterm Abortions TAB SAB Ect Mult Living   3 1 1  1 1    1       Gynecological History: OB History   Grav Para Term Preterm Abortions TAB SAB Ect Mult Living   3 1 1  1 1    1       Social History: History   Social History  . Marital Status: Single    Spouse Name: N/A    Number of Children: N/A  . Years of Education: N/A   Social History Main Topics  . Smoking status: Former Games developermoker  . Smokeless tobacco: Never Used  . Alcohol Use: No  . Drug Use: Yes    Special: Marijuana     Comment: states does not smoke weed now  . Sexual Activity: Yes    Birth Control/ Protection: None   Other Topics Concern  . None   Social History Narrative  . None    Family History: Family History  Problem Relation Age of Onset  . Diabetes Mother   . Diabetes Father     Allergies: No Known Allergies  Prescriptions prior to admission  Medication Sig Dispense Refill  . cyclobenzaprine (FLEXERIL) 10 MG tablet Take 1 tablet (10 mg total) by mouth once.  30 tablet  0  . oxyCODONE-acetaminophen (PERCOCET) 5-325 MG per tablet Take 1-2 tablets by mouth every 6 (six) hours as needed for severe pain.  30 tablet  0  . Prenatal Vit-Fe  Fumarate-FA (PRENATAL MULTIVITAMIN) TABS tablet Take 1 tablet by mouth daily at 12 noon.         Review of Systems   Constitutional: no complaints at this time.  Blood pressure 146/83, pulse 82, temperature 99 F (37.2 C), temperature source Oral, resp. rate 20, height 5\' 4"  (1.626 m), weight 106.595 kg (235 lb), last menstrual period 04/24/2013. General appearance: alert, cooperative, appears stated age and no distress Lungs: clear to auscultation bilaterally Heart: regular rate and rhythm Abdomen: soft, non-tender; bowel sounds normal Pelvic: adequate SSE: no HSV lesions seen on spec exam Extremities: Homans sign is negative, no sign of DVT DTR's 2+ Presentation: cephalic Fetal monitoringBaseline: 130s bpm, Variability: Good {> 6 bpm), Accelerations: Reactive and Decelerations: Absent Uterine activity irregular q8-8710min  Dilation: 1.5 Effacement (%): Thick Station: -3 Exam by:: Dr Ike Benedom   Prenatal labs: ABO, Rh: O/Positive/-- (04/16 1747) Antibody: Negative (04/16 1747) Rubella:  Immune RPR: Nonreactive (04/16 1747)  HBsAg: Negative (04/16 1747)  HIV: Non-reactive (04/16 1747)  GBS: Negative (04/16 1747)  1 hr Glucola 106 Genetic screening  Too late to care Anatomy US nml   Prenatal Transfer Tool  Maternal Diabetes: No Genetic Screening: Too  late to care Maternal Ultrasounds/Referrals: Normal Fetal Ultrasounds or other Referrals:  None Maternal Substance Abuse:  Yes:  Type: Marijuana  In early trimester Significant Maternal Medications:  percocet Significant Maternal Lab Results: Lab values include: Group B Strep negative, Other: hx of HSV in 2009. Did no receive prophylaxis     Results for orders placed during the hospital encounter of 01/21/14 (from the past 24 hour(s))  AMNISURE RUPTURE OF MEMBRANE (ROM)   Collection Time    01/21/14  5:05 PM      Result Value Ref Range   Amnisure ROM POSITIVE    OB RESULTS CONSOLE GBS   Collection Time    01/21/14   5:47 PM      Result Value Ref Range   GBS Negative    OB RESULTS CONSOLE GC/CHLAMYDIA   Collection Time    01/21/14  5:47 PM      Result Value Ref Range   Gonorrhea Negative     Chlamydia Negative    OB RESULTS CONSOLE RPR   Collection Time    01/21/14  5:47 PM      Result Value Ref Range   RPR Nonreactive    OB RESULTS CONSOLE HIV ANTIBODY (ROUTINE TESTING)   Collection Time    01/21/14  5:47 PM      Result Value Ref Range   HIV Non-reactive    OB RESULTS CONSOLE RUBELLA ANTIBODY, IGM   Collection Time    01/21/14  5:47 PM      Result Value Ref Range   Rubella Immune    OB RESULTS CONSOLE HEPATITIS B SURFACE ANTIGEN   Collection Time    01/21/14  5:47 PM      Result Value Ref Range   Hepatitis B Surface Ag Negative    OB RESULTS CONSOLE ABO/RH   Collection Time    01/21/14  5:47 PM      Result Value Ref Range   RH Type  Positive     ABO Grouping O    OB RESULTS CONSOLE ANTIBODY SCREEN   Collection Time    01/21/14  5:47 PM      Result Value Ref Range   Antibody Screen Negative      Assessment: Kara Myers is a 23 y.o. G3P1011 at 8360w6d by L=26 here for PROM #Labor: will start induciton of labor with cytotec 50mcg PO #Pain: IV pain meds and epidural PRN #FWB: Cat I #ID:  GBS Neg, HSV not tx no lesions seen on spec exam #MOF: breast  Minta BalsamMichael R Keatyn Luck 01/21/2014, 6:24 PM

## 2014-01-21 NOTE — Progress Notes (Signed)
Kara Myers is a 23 y.o. G3P1011 at 7848w6d by LMP admitted for induction of labor due to PROM.  Subjective: Feeling some ctx but no significant pain  Objective: BP 122/86  Pulse 75  Temp(Src) 98.6 F (37 C) (Oral)  Resp 20  Ht 5\' 4"  (1.626 m)  Wt 106.595 kg (235 lb)  BMI 40.32 kg/m2  LMP 04/24/2013      FHT:  FHR: 130s bpm, variability: moderate,  accelerations:  Present,  decelerations:  Absent UC:   Poorly traced right now SVE:   Dilation: 1.5 Effacement (%): Thick Station: -3 Exam by:: Dr Ike Benedom  Labs: Lab Results  Component Value Date   WBC 10.1 01/21/2014   HGB 11.6* 01/21/2014   HCT 34.1* 01/21/2014   MCV 88.1 01/21/2014   PLT 273 01/21/2014    Assessment / Plan: Induction of labor due to PROM,  On cytotec  Labor: IOL for PROM with cytotec, FB when able Preeclampsia:  no signs or symptoms of toxicity Fetal Wellbeing:  Category I Pain Control:  Epidural and Fentanyl PRN I/D:  n/a Anticipated MOD:  NSVD  Minta BalsamMichael R Dilan Novosad 01/21/2014, 8:21 PM

## 2014-01-22 ENCOUNTER — Encounter (HOSPITAL_COMMUNITY): Payer: Self-pay | Admitting: *Deleted

## 2014-01-22 ENCOUNTER — Encounter (HOSPITAL_COMMUNITY): Payer: Medicaid Other | Admitting: Anesthesiology

## 2014-01-22 ENCOUNTER — Inpatient Hospital Stay (HOSPITAL_COMMUNITY): Payer: Medicaid Other | Admitting: Anesthesiology

## 2014-01-22 LAB — COMPREHENSIVE METABOLIC PANEL
ALBUMIN: 2.8 g/dL — AB (ref 3.5–5.2)
ALK PHOS: 122 U/L — AB (ref 39–117)
ALT: 9 U/L (ref 0–35)
AST: 16 U/L (ref 0–37)
BUN: 5 mg/dL — ABNORMAL LOW (ref 6–23)
CALCIUM: 9.2 mg/dL (ref 8.4–10.5)
CO2: 23 mEq/L (ref 19–32)
Chloride: 99 mEq/L (ref 96–112)
Creatinine, Ser: 0.51 mg/dL (ref 0.50–1.10)
GFR calc non Af Amer: >90 mL/min (ref >90)
Glucose, Bld: 87 mg/dL (ref 70–99)
POTASSIUM: 4 meq/L (ref 3.7–5.3)
SODIUM: 137 meq/L (ref 137–147)
TOTAL PROTEIN: 6.7 g/dL (ref 6.0–8.3)
Total Bilirubin: 0.2 mg/dL — ABNORMAL LOW (ref 0.3–1.2)

## 2014-01-22 LAB — PROTEIN / CREATININE RATIO, URINE
Creatinine, Urine: 48.98 mg/dL
PROTEIN CREATININE RATIO: 0.11 (ref 0.00–0.15)
Total Protein, Urine: 5.3 mg/dL

## 2014-01-22 MED ORDER — PHENYLEPHRINE 40 MCG/ML (10ML) SYRINGE FOR IV PUSH (FOR BLOOD PRESSURE SUPPORT)
80.0000 ug | PREFILLED_SYRINGE | INTRAVENOUS | Status: DC | PRN
  Filled 2014-01-22: qty 10
  Filled 2014-01-22: qty 2

## 2014-01-22 MED ORDER — TERBUTALINE SULFATE 1 MG/ML IJ SOLN
0.2500 mg | Freq: Once | INTRAMUSCULAR | Status: AC | PRN
  Administered 2014-01-22: 0.25 mg via SUBCUTANEOUS
  Filled 2014-01-22: qty 1

## 2014-01-22 MED ORDER — DIPHENHYDRAMINE HCL 50 MG/ML IJ SOLN: 12.5000 mg | INTRAMUSCULAR | Status: DC | PRN

## 2014-01-22 MED ORDER — EPHEDRINE 5 MG/ML INJ
10.0000 mg | INTRAVENOUS | Status: DC | PRN
  Filled 2014-01-22: qty 4
  Filled 2014-01-22: qty 2

## 2014-01-22 MED ORDER — PHENYLEPHRINE 40 MCG/ML (10ML) SYRINGE FOR IV PUSH (FOR BLOOD PRESSURE SUPPORT)
80.0000 ug | PREFILLED_SYRINGE | INTRAVENOUS | Status: DC | PRN
  Filled 2014-01-22: qty 2

## 2014-01-22 MED ORDER — OXYTOCIN 40 UNITS IN LACTATED RINGERS INFUSION - SIMPLE MED
1.0000 m[IU]/min | INTRAVENOUS | Status: DC
  Administered 2014-01-22: 2 m[IU]/min via INTRAVENOUS
  Filled 2014-01-22: qty 1000

## 2014-01-22 MED ORDER — FENTANYL 2.5 MCG/ML BUPIVACAINE 1/10 % EPIDURAL INFUSION (WH - ANES)
14.0000 mL/h | INTRAMUSCULAR | Status: DC | PRN
  Administered 2014-01-22 (×3): 14 mL/h via EPIDURAL
  Filled 2014-01-22 (×3): qty 125

## 2014-01-22 MED ORDER — EPHEDRINE 5 MG/ML INJ
10.0000 mg | INTRAVENOUS | Status: DC | PRN
  Filled 2014-01-22: qty 2

## 2014-01-22 MED ORDER — LACTATED RINGERS IV SOLN
500.0000 mL | Freq: Once | INTRAVENOUS | Status: AC
  Administered 2014-01-23: 01:00:00 via INTRAVENOUS

## 2014-01-22 MED ORDER — LIDOCAINE HCL (PF) 1 % IJ SOLN
INTRAMUSCULAR | Status: DC | PRN
  Administered 2014-01-22 (×4): 4 mL

## 2014-01-22 NOTE — Progress Notes (Signed)
Kara Myers is a 23 y.o. G3P1011 at 3740w0d admitted for induction of labor due to PROM.  Subjective:    Objective: BP 136/108  Pulse 75  Temp(Src) 98.4 F (36.9 C) (Oral)  Resp 20  Ht 5\' 4"  (1.626 m)  Wt 106.595 kg (235 lb)  BMI 40.32 kg/m2  SpO2 98%  LMP 04/24/2013      FHT:  FHR: 135 bpm, variability: moderate,  accelerations:  Present,  decelerations:  Absent UC:   irregular, every 2-5 minutes SVE:   Dilation: 5.5 Effacement (%): 70 Station: -3 Exam by:: c soliz rn  Labs: Lab Results  Component Value Date   WBC 10.1 01/21/2014   HGB 11.6* 01/21/2014   HCT 34.1* 01/21/2014   MCV 88.1 01/21/2014   PLT 273 01/21/2014    Assessment / Plan: IOL due to PROM. progressing on pitocin  Labor: cont to increase pit and plan to AROM eventually Fetal Wellbeing:  Category I Pain Control:  Epidural I/D:  n/a Anticipated MOD:  NSVD  Kara Myers 01/22/2014, 2:23 PM

## 2014-01-22 NOTE — Progress Notes (Signed)
Kara Myers is a 23 y.o. G3P1011 at 6727w0d admitted for induction of labor due to PROM.  Subjective: Recurrent decel called to room. Pit off, Rolled patient, O2 bolus  Objective: BP 132/68  Pulse 95  Temp(Src) 98.4 F (36.9 C) (Oral)  Resp 16  Ht 5\' 4"  (1.626 m)  Wt 106.595 kg (235 lb)  BMI 40.32 kg/m2  SpO2 98%  LMP 04/24/2013   Total I/O In: -  Out: 700 [Urine:700]  FHT:  FHR: 120 bpm, variability: moderate,  accelerations:  Present,  decelerations: recurrent decels for ~226mins to 80s now improved with variability UC:   regular, every 3-5 minutes SVE:   Dilation: 7 Effacement (%): 70 Station: -2 Exam by:: Dr. Ike Myers  Labs: Lab Results  Component Value Date   WBC 10.1 01/21/2014   HGB 11.6* 01/21/2014   HCT 34.1* 01/21/2014   MCV 88.1 01/21/2014   PLT 273 01/21/2014    Assessment / Plan: Induction of labor due to PROM, 2nd pit stop from prolonged decel. Restart in 1 hr at 1x1  Labor: IUPC in place, Restart pit 60 mins after stable strip 1x1 Fetal Wellbeing:  Category II, decel appear to be related to pit restarting Pain Control:  Epidural I/D:  n/a Anticipated MOD:  NSVD  Kara Myers 01/22/2014, 10:29 PM

## 2014-01-22 NOTE — Anesthesia Preprocedure Evaluation (Signed)
Anesthesia Evaluation  Patient identified by MRN, date of birth, ID band Patient awake    Reviewed: Allergy & Precautions, H&P , NPO status , Patient's Chart, lab work & pertinent test results, reviewed documented beta blocker date and time   History of Anesthesia Complications Negative for: history of anesthetic complications  Airway Mallampati: III TM Distance: >3 FB Neck ROM: full    Dental  (+) Teeth Intact   Pulmonary neg pulmonary ROS, former smoker,  breath sounds clear to auscultation        Cardiovascular negative cardio ROS  Rhythm:regular Rate:Normal     Neuro/Psych negative neurological ROS  negative psych ROS   GI/Hepatic negative GI ROS, (+)       marijuana use,   Endo/Other  Morbid obesity  Renal/GU negative Renal ROS  negative genitourinary   Musculoskeletal   Abdominal   Peds  Hematology  (+) Sickle cell trait ,   Anesthesia Other Findings   Reproductive/Obstetrics (+) Pregnancy                           Anesthesia Physical Anesthesia Plan  ASA: III  Anesthesia Plan: Epidural   Post-op Pain Management:    Induction:   Airway Management Planned:   Additional Equipment:   Intra-op Plan:   Post-operative Plan:   Informed Consent: I have reviewed the patients History and Physical, chart, labs and discussed the procedure including the risks, benefits and alternatives for the proposed anesthesia with the patient or authorized representative who has indicated his/her understanding and acceptance.     Plan Discussed with:   Anesthesia Plan Comments:         Anesthesia Quick Evaluation

## 2014-01-22 NOTE — Progress Notes (Signed)
   Thana Farrntoinette T Giraldo is a 23 y.o. G3P1011 at 8238w0d  admitted for PROM  Subjective: Still sleeping.  Hungry and wants a shower  Objective: BP 122/53  Pulse 78  Temp(Src) 98.2 F (36.8 C) (Oral)  Resp 16  Ht 5\' 4"  (1.626 m)  Wt 106.595 kg (235 lb)  BMI 40.32 kg/m2  SpO2 100%  LMP 04/24/2013    FHT:  FHR: 125 bpm, variability: moderate,  accelerations:  Present,  decelerations:  Absent UC:   irregular, every 3-8 minutes SVE: deferred     Labs: Lab Results  Component Value Date   WBC 10.1 01/21/2014   HGB 11.6* 01/21/2014   HCT 34.1* 01/21/2014   MCV 88.1 01/21/2014   PLT 273 01/21/2014    Assessment / Plan: PROM, ripening phase Will allow a light breakfast, shower, then hopefully get a foley bulb in Labor: no Fetal Wellbeing:  Category I Pain Control:  Labor support without medications Anticipated MOD:  NSVD  Jacklyn ShellFrances Cresenzo-Dishmon 01/22/2014, 6:42 AM

## 2014-01-22 NOTE — Progress Notes (Signed)
   Kara Myers is a 23 y.o. G3P1011 at 5020w0d  admitted for PROM  Subjective: sleeping  Objective: BP 127/72  Pulse 79  Temp(Src) 98.5 F (36.9 C) (Oral)  Resp 18  Ht 5\' 4"  (1.626 m)  Wt 106.595 kg (235 lb)  BMI 40.32 kg/m2  LMP 04/24/2013    FHT:  FHR: 130 bpm, variability: moderate,  accelerations:  Present,  decelerations:  Absent UC:   occasional SVE:   Dilation: 1.5 Effacement (%): Thick Station: -3 Exam by:: T.Sprague RN Last cytotec at 2300  Labs: Lab Results  Component Value Date   WBC 10.1 01/21/2014   HGB 11.6* 01/21/2014   HCT 34.1* 01/21/2014   MCV 88.1 01/21/2014   PLT 273 01/21/2014    Assessment / Plan: IOL for PROM, ripening phase  Labor: no Fetal Wellbeing:  Category I Pain Control:  Labor support without medications Anticipated MOD:  NSVD  Jacklyn ShellFrances Cresenzo-Dishmon 01/22/2014, 12:43 AM

## 2014-01-22 NOTE — Progress Notes (Signed)
Kara Myers is a 23 y.o. G3P1011 at 7423w0d admitted for induction of labor due to PROM.  Subjective: While in room pt had a prolonged decel, recovered with hands and knees, turning off pit, fluid bolus and O2. Objective: BP 132/68  Pulse 95  Temp(Src) 98.4 F (36.9 C) (Oral)  Resp 16  Ht 5\' 4"  (1.626 m)  Wt 106.595 kg (235 lb)  BMI 40.32 kg/m2  SpO2 98%  LMP 04/24/2013   Total I/O In: -  Out: 700 [Urine:700]  FHT:  FHR: 120 bpm, variability: moderate,  accelerations:  Present,  decelerations: prolonged decels for ~855mins to 80s now improved with occassion early UC:   regular, every 2-5 minutes SVE:   Dilation: 5.5 Effacement (%): 50 Station: -2 Exam by:: Dr. Ike Benedom  Labs: Lab Results  Component Value Date   WBC 10.1 01/21/2014   HGB 11.6* 01/21/2014   HCT 34.1* 01/21/2014   MCV 88.1 01/21/2014   PLT 273 01/21/2014    Assessment / Plan: Induction of labor due to PROM,  Pit off, restart after 30 min rest  Labor: IUPC in place, Restart pit 30 mins after stable strip at half dose. Continue to inc PRN Fetal Wellbeing:  Category II, decel appear to be related to tachysystole. Pain Control:  Epidural I/D:  n/a Anticipated MOD:  NSVD  Minta BalsamMichael R Hovanes Hymas 01/22/2014, 10:07 PM

## 2014-01-22 NOTE — Progress Notes (Signed)
Kara Myers is a 23 y.o. G3P1011 at 6323w0d admitted for induction of labor due to PROM.  Subjective:  Doing well now. Earlier had increased pain and noted that the epidural catheter wasn't threaded anymore. Now has been fixed and she is comfortable. +FM.    Objective: BP 130/70  Pulse 103  Temp(Src) 97.5 F (36.4 C) (Axillary)  Resp 16  Ht 5\' 4"  (1.626 m)  Wt 106.595 kg (235 lb)  BMI 40.32 kg/m2  SpO2 98%  LMP 04/24/2013      FHT:  FHR: 120 bpm, variability: moderate,  accelerations:  Present,  decelerations:  Present early UC:   regular, every 2-5 minutes SVE:   Dilation: 5.5 Effacement (%): 50 Station: -3 Exam by:: Jessly Lebeck  Labs: Lab Results  Component Value Date   WBC 10.1 01/21/2014   HGB 11.6* 01/21/2014   HCT 34.1* 01/21/2014   MCV 88.1 01/21/2014   PLT 273 01/21/2014    Assessment / Plan: Induction of labor due to PROM,  progressing well on pitocin  Labor: IUPC showing inadequate contractions with MVUs from 150-200. coupling pattern concerning for possible OP presentation. place on birthing ball. cont to increase pitocin Fetal Wellbeing:  Category I Pain Control:  Epidural I/D:  n/a Anticipated MOD:  NSVD  Coburn Knaus L Stela Iwasaki 01/22/2014, 8:43 PM

## 2014-01-22 NOTE — Progress Notes (Signed)
Patient awake and removed from EFM to shower and instructed to call out to nurses station when ready to be placed on monitor, patient verbalized understanding

## 2014-01-22 NOTE — Anesthesia Procedure Notes (Signed)
Epidural Patient location during procedure: OB Start time: 01/22/2014 12:13 PM  Staffing Performed by: anesthesiologist   Preanesthetic Checklist Completed: patient identified, site marked, surgical consent, pre-op evaluation, timeout performed, IV checked, risks and benefits discussed and monitors and equipment checked  Epidural Patient position: sitting Prep: site prepped and draped and DuraPrep Patient monitoring: continuous pulse ox and blood pressure Approach: midline Injection technique: LOR air  Needle:  Needle type: Tuohy  Needle gauge: 17 G Needle length: 9 cm and 9 Needle insertion depth: 7 cm Catheter type: closed end flexible Catheter size: 19 Gauge Catheter at skin depth: 12 cm Test dose: negative  Assessment Events: blood not aspirated, injection not painful, no injection resistance, negative IV test and no paresthesia  Additional Notes Discussed risk of headache, infection, bleeding, nerve injury and failed or incomplete block.  Patient voices understanding and wishes to proceed.  Epidural placed easily on first attempt.  No paresthesia.  Patient tolerated procedure well with no apparent complications.  A> Myeesha Shane, MDReason for block:procedure for pain

## 2014-01-23 ENCOUNTER — Encounter (HOSPITAL_COMMUNITY): Payer: Self-pay | Admitting: General Practice

## 2014-01-23 DIAGNOSIS — O411090 Infection of amniotic sac and membranes, unspecified, unspecified trimester, not applicable or unspecified: Secondary | ICD-10-CM

## 2014-01-23 DIAGNOSIS — O4290 Premature rupture of membranes, unspecified as to length of time between rupture and onset of labor, unspecified weeks of gestation: Secondary | ICD-10-CM

## 2014-01-23 DIAGNOSIS — O98519 Other viral diseases complicating pregnancy, unspecified trimester: Secondary | ICD-10-CM

## 2014-01-23 MED ORDER — PRENATAL MULTIVITAMIN CH
1.0000 | ORAL_TABLET | Freq: Every day | ORAL | Status: DC
  Administered 2014-01-23 – 2014-01-24 (×2): 1 via ORAL
  Filled 2014-01-23 (×2): qty 1

## 2014-01-23 MED ORDER — SIMETHICONE 80 MG PO CHEW
80.0000 mg | CHEWABLE_TABLET | ORAL | Status: DC | PRN
Start: 2014-01-23 — End: 2014-01-24

## 2014-01-23 MED ORDER — LANOLIN HYDROUS EX OINT: TOPICAL_OINTMENT | CUTANEOUS | Status: DC | PRN

## 2014-01-23 MED ORDER — OXYCODONE-ACETAMINOPHEN 5-325 MG PO TABS
1.0000 | ORAL_TABLET | ORAL | Status: DC | PRN
Start: 2014-01-23 — End: 2014-01-24

## 2014-01-23 MED ORDER — IBUPROFEN 600 MG PO TABS
600.0000 mg | ORAL_TABLET | Freq: Four times a day (QID) | ORAL | Status: DC
  Administered 2014-01-23 – 2014-01-24 (×6): 600 mg via ORAL
  Filled 2014-01-23 (×6): qty 1

## 2014-01-23 MED ORDER — DIBUCAINE 1 % RE OINT
1.0000 | TOPICAL_OINTMENT | RECTAL | Status: DC | PRN
Start: 2014-01-23 — End: 2014-01-24

## 2014-01-23 MED ORDER — ZOLPIDEM TARTRATE 5 MG PO TABS: 5.0000 mg | ORAL_TABLET | Freq: Every evening | ORAL | Status: DC | PRN

## 2014-01-23 MED ORDER — SENNOSIDES-DOCUSATE SODIUM 8.6-50 MG PO TABS
2.0000 | ORAL_TABLET | ORAL | Status: DC
  Administered 2014-01-23: 2 via ORAL
  Filled 2014-01-23: qty 2

## 2014-01-23 MED ORDER — ONDANSETRON HCL 4 MG PO TABS: 4.0000 mg | ORAL_TABLET | ORAL | Status: DC | PRN

## 2014-01-23 MED ORDER — DIPHENHYDRAMINE HCL 25 MG PO CAPS: 25.0000 mg | ORAL_CAPSULE | Freq: Four times a day (QID) | ORAL | Status: DC | PRN

## 2014-01-23 MED ORDER — BENZOCAINE-MENTHOL 20-0.5 % EX AERO
1.0000 "application " | INHALATION_SPRAY | CUTANEOUS | Status: DC | PRN
  Administered 2014-01-23: 1 via TOPICAL

## 2014-01-23 MED ORDER — ONDANSETRON HCL 4 MG/2ML IJ SOLN: 4.0000 mg | INTRAMUSCULAR | Status: DC | PRN

## 2014-01-23 MED ORDER — WITCH HAZEL-GLYCERIN EX PADS: 1.0000 "application " | MEDICATED_PAD | CUTANEOUS | Status: DC | PRN

## 2014-01-23 MED ORDER — TETANUS-DIPHTH-ACELL PERTUSSIS 5-2.5-18.5 LF-MCG/0.5 IM SUSP
0.5000 mL | Freq: Once | INTRAMUSCULAR | Status: AC
  Administered 2014-01-24: 0.5 mL via INTRAMUSCULAR
  Filled 2014-01-23: qty 0.5

## 2014-01-23 NOTE — Anesthesia Postprocedure Evaluation (Signed)
Anesthesia Post Note  Patient: Kara Myers  Procedure(s) Performed: * No procedures listed *  Anesthesia type: Epidural  Patient location: Mother/Baby  Post pain: Pain level controlled  Post assessment: Post-op Vital signs reviewed  Last Vitals:  Filed Vitals:   01/23/14 0920  BP: 135/73  Pulse: 82  Temp: 36.8 C  Resp: 18    Post vital signs: Reviewed  Level of consciousness:alert  Complications: No apparent anesthesia complications

## 2014-01-23 NOTE — Clinical Social Work Maternal (Signed)
    Clinical Social Work Department PSYCHOSOCIAL ASSESSMENT - MATERNAL/CHILD 01/23/2014  Patient:  Kara Myers,Kara Myers  Account Number:  [id number]  Admit Date:  01/21/2014  Marjo Bickerhilds Name:   Kara Myers, Jr.    Clinical Social Worker:  Nobie PutnamEDRA Gabrielly Mccrystal, LCSW   Date/Time:  01/23/2014 12:01 PM  Date Referred:  01/23/2014   Referral source  CN     Referred reason  Substance Abuse   Other referral source:    I:  FAMILY / HOME ENVIRONMENT Child's legal guardian:  PARENT  Guardian - Name Guardian - Age Guardian - Address  Kara Myers 22 [address] [place], [place] [address]  Kara Myers 27 (same as above)   Other household support members/support persons Name Relationship DOB   DAUGHTER 10/05/06   Other support:    II  PSYCHOSOCIAL DATA Information Source:  Patient Interview  Surveyor, quantityinancial and Community Resources Employment:   [employer]   Financial resources:  [employer] If [place]  [place] Other  Sales executiveood Stamps  WIC   School / Grade:   Maternity Care Coordinator / Child Services Coordination / Early Interventions:  Cultural issues impacting care:    III  STRENGTHS Strengths  Adequate Resources  Home prepared for Child (including basic supplies)  Supportive family/friends   Strength comment:    IV  RISK FACTORS AND CURRENT PROBLEMS Current Problem:  YES   Risk Factor & Current Problem Patient Issue Family Issue Risk Factor / Current Problem Comment  Substance Abuse Y N Hx of MJ use    V  SOCIAL WORK ASSESSMENT CSW referral received to assess history of MJ use.  Pt admits to smoking MJ "once or twice a month" prior to pregnancy confirmation at 2 weeks.  Once pregnancy was confirmed, she stopped smoking immediately.  She denies any other illegal substance use.  UDS & meconium collections are pending.  FOB was at the bedside & appears to be happy about the birth of his son.  The couple has all the necessary supplies for the  infant & good support.  The couple seems to be bonding well with the infant & appropriate at this time.  CSW will continue to monitor drug screen results & make a referral if needed.      VI SOCIAL WORK PLAN Social Work Plan  No Further Intervention Required / No Barriers to Discharge   Type of pt/family education:   If child protective services report - county:   If child protective services report - date:   Information/referral to community resources comment:   Other social work plan:

## 2014-01-23 NOTE — Lactation Note (Signed)
This note was copied from the chart of Kara Myers. Lactation Consultation Note Baby sleepy, had a couple good feedings. States latched well. Mom has lg. Soft pendulum breast. Demonstrated elevating breast on dry wash cloth to feed. Hand expression taught to Mom. Mom encouraged to feed baby 8-12 times/24 hours and with feeding cues. Reviewed Baby & Me book's Breastfeeding Basics. Mom encouraged to feed baby w/feeding cuesMom made aware of O/P services, breastfeeding support groups, community resources, and our phone # for post-discharge questions. Encouraged to call for assistance if needed and to verify proper latch. Demonstrated to roll nipple for deeper latch. Has + colostrum. Patient Name: Kara Myers     Maternal Data    Feeding Feeding Type: Breast Fed Length of feed: 0 min  [facility]                      Lactation Tools Discussed/Used     Consult Status      Kara Myers Myers, 1:05 PM

## 2014-01-24 MED ORDER — IBUPROFEN 600 MG PO TABS: 600.0000 mg | ORAL_TABLET | Freq: Four times a day (QID) | ORAL | Status: DC

## 2014-01-24 NOTE — Discharge Instructions (Signed)

## 2014-01-24 NOTE — Discharge Summary (Signed)
Attestation of Attending Supervision of Fellow: Evaluation and management procedures were performed by the Fellow under my supervision and collaboration.  I have reviewed the Fellow's note and chart, and I agree with the management and plan.    

## 2014-01-24 NOTE — Discharge Summary (Signed)
  Obstetric Discharge Summary Reason for Admission: induction of labor for PPROM Prenatal Procedures: none Intrapartum Procedures: spontaneous vaginal delivery  Postpartum Procedures: Chorio Complications-Operative and Postpartum: none  Hospital Course: Admitted on 4/16 for PPROM and was induced to a vaginal delivery. Note below. Pt with chorio during labor but resolved PP. No issues PP, meeting milestones, discharge home breast feeding. Pt to follow up at HD for mirena placement.  Delivery Note At 1:23 AM a viable female was delivered via Vaginal, Spontaneous Delivery (Presentation: Right Occiput Anterior).  APGAR: 8, 9; weight .   Placenta status: Intact, Spontaneous.  Cord: 3 vessels with the following complications: None.  Cord pH: not sent  Anesthesia: Epidural  Episiotomy: None Lacerations: None Suture Repair: NA Est. Blood Loss (mL): 250  Mom to postpartum.  Baby to Couplet care / Skin to Skin.  Called to room for decel. Pt was complete. Improved deceleration and pushed over intact perineum with 3 contractions delivered crying infant to maternal abdomen. Traction delivered intact placenta followed by pit. EBL 250. Counts correct. Hemostatic.  Kara BalsamMichael R Vantasia Myers 01/23/2014, 1:39 AM   H/H: Lab Results  Component Value Date/Time   HGB 11.6* 01/21/2014  6:25 PM   HCT 34.1* 01/21/2014  6:25 PM    Filed Vitals:   01/24/14 0505  BP: 128/83  Pulse: 67  Temp: 98.4 F (36.9 C)  Resp: 20    Physical Exam: VSS NAD Abd: Appropriately tender, ND, Fundus @U  Incision: NA No c/c/e, Neg homan's sign, neg cords Lochia Appropriate  Discharge Diagnoses: Term Pregnancy-delivered  Discharge Information: Date: 04/19/2011 Activity: pelvic rest Diet: routine  Medications: PNV and Ibuprofen Breast feeding:  Yes Condition: stable Instructions: refer to handout Discharge to: home      Medication List    STOP taking these medications       oxyCODONE-acetaminophen 5-325 MG per  tablet  Commonly known as:  PERCOCET      TAKE these medications       cyclobenzaprine 10 MG tablet  Commonly known as:  FLEXERIL  Take 1 tablet (10 mg total) by mouth once.     ibuprofen 600 MG tablet  Commonly known as:  ADVIL,MOTRIN  Take 1 tablet (600 mg total) by mouth every 6 (six) hours.     prenatal multivitamin Tabs tablet  Take 1 tablet by mouth daily at 12 noon.           Follow-up Information   [facility] [place] Schedule an appointment as soon as possible for a visit in 4 weeks. (For Postpartum Visit and mirena placement)    Contact information:   [address] [place] [address] [phone]      Kara BalsamMichael R Cassandr Cederberg 01/24/2014,9:48 AM

## 2014-01-24 NOTE — MAU Provider Note (Signed)
Attestation of Attending Supervision of Fellow: Evaluation and management procedures were performed by the Fellow under my supervision and collaboration.  I have reviewed the Fellow's note and chart, and I agree with the management and plan.    

## 2014-01-25 ENCOUNTER — Ambulatory Visit: Payer: Self-pay

## 2014-01-25 NOTE — Progress Notes (Signed)
Post discharge ur review completed. 

## 2014-01-25 NOTE — H&P (Signed)
Attestation of Attending Supervision of Obstetric Fellow: Evaluation and management procedures were performed by the Obstetric Fellow under my supervision and collaboration.  I have reviewed the Obstetric Fellow's note and chart, and I agree with the management and plan.  Vertie Dibbern, MD, FACOG Attending Obstetrician & Gynecologist Faculty Practice, [facility]   

## 2014-01-25 NOTE — Lactation Note (Signed)
This note was copied from the chart of Kara Casaundra Molden. Lactation Consultation Note  Patient Name: Kara Myers EAVWU'JToday's Date: 01/25/2014 Reason for consult: Follow-up assessment Per mom the baby is breast feeding well , both breast . Dad had given 5 ml of formula at 0950 . LC suggested since it had been since 0600 for a good feed to wake  Baby . Baby noted to be sluggish , but did latch for 6 mins with multiply swallows and fell asleep. LC reviewed basics , breast massage , hand express, prep pump if needed if to full , sore nipple and engorgement prevention and tx .  Mom aware of the [facility] and [facility].    Maternal Data Has patient been taught Hand Expression?: Yes (noted steady flow os colostrum )  Feeding Feeding Type: Breast Fed Length of feed: 6 min (multiply swallows noted )  LATCH Score/Interventions Latch: Grasps breast easily, tongue down, lips flanged, rhythmical sucking. Intervention(s): Adjust position;Assist with latch;Breast massage;Breast compression  Audible Swallowing: Spontaneous and intermittent  Type of Nipple: Everted at rest and after stimulation  Comfort (Breast/Nipple): Soft / non-tender     Hold (Positioning): Assistance needed to correctly position infant at breast and maintain latch. (worked on depth ) Intervention(s): Breastfeeding basics reviewed;Support Pillows;Position options;Skin to skin  LATCH Score: 9  Lactation Tools Discussed/Used     Consult Status Consult Status: Complete Date: 01/25/14 Follow-up type: In-patient    Kara Myers 01/25/2014, 10:34 AM

## 2014-01-25 NOTE — Lactation Note (Signed)
This note was copied from the chart of Kara Myers. Lactation Consultation Note Mainly bottle feeding, put to the breast once for a few sucks, states will put the baby to breast occasionally for stimulation but has no milk. Isn't interested in totally breast feeding at this time. Patient Name: Kara Filbert Schilderntoinette Thuman WUJWJ'XToday's Date: 01/25/2014     Maternal Data    Feeding    LATCH Score/Interventions                      Lactation Tools Discussed/Used     Consult Status      Charyl DancerLaura G Luccas Towell 01/25/2014, 6:32 AM

## 2014-02-09 ENCOUNTER — Telehealth: Payer: Self-pay | Admitting: Obstetrics & Gynecology

## 2014-02-09 NOTE — Telephone Encounter (Signed)
She went to see pt yesterday to check pts BP and pt was not home at the scheduled appt time.  Per the nurse, the pt has not returned any ofher phone calls.

## 2014-02-09 NOTE — Telephone Encounter (Signed)
Pt. Returned call. Pt. States she can only come to clinic on Thursday at 1400. Informed pt. This would be OK and that we will put her on the schedule. Pt. Verbalized understanding. NO further questions or concerns.

## 2014-02-09 NOTE — Telephone Encounter (Signed)
Attempted to call pt. No answer. Left message stating we are trying to reach you about an appointment please call clinic.

## 2014-02-11 ENCOUNTER — Ambulatory Visit: Payer: Medicaid Other

## 2014-08-09 ENCOUNTER — Encounter (HOSPITAL_COMMUNITY): Payer: Self-pay | Admitting: General Practice

## 2014-12-12 ENCOUNTER — Emergency Department (HOSPITAL_COMMUNITY)
Admission: EM | Admit: 2014-12-12 | Discharge: 2014-12-12 | Disposition: A | Discharge status: Home / Self Care | Payer: Medicaid Other | Attending: Emergency Medicine | Admitting: Emergency Medicine

## 2014-12-12 ENCOUNTER — Encounter (HOSPITAL_COMMUNITY): Payer: Self-pay | Admitting: Emergency Medicine

## 2014-12-12 DIAGNOSIS — Z8619 Personal history of other infectious and parasitic diseases: Secondary | ICD-10-CM | POA: Insufficient documentation

## 2014-12-12 DIAGNOSIS — Z87891 Personal history of nicotine dependence: Secondary | ICD-10-CM | POA: Insufficient documentation

## 2014-12-12 DIAGNOSIS — J020 Streptococcal pharyngitis: Secondary | ICD-10-CM | POA: Insufficient documentation

## 2014-12-12 DIAGNOSIS — Z79899 Other long term (current) drug therapy: Secondary | ICD-10-CM | POA: Insufficient documentation

## 2014-12-12 DIAGNOSIS — Z87440 Personal history of urinary (tract) infections: Secondary | ICD-10-CM | POA: Insufficient documentation

## 2014-12-12 DIAGNOSIS — R1084 Generalized abdominal pain: Secondary | ICD-10-CM | POA: Insufficient documentation

## 2014-12-12 DIAGNOSIS — Z862 Personal history of diseases of the blood and blood-forming organs and certain disorders involving the immune mechanism: Secondary | ICD-10-CM | POA: Insufficient documentation

## 2014-12-12 LAB — INFLUENZA PANEL BY PCR (TYPE A & B)
H1N1 flu by pcr: NOT DETECTED
Influenza A By PCR: NEGATIVE
Influenza B By PCR: NEGATIVE

## 2014-12-12 LAB — RAPID STREP SCREEN (MED CTR MEBANE ONLY): Streptococcus, Group A Screen (Direct): POSITIVE — AB

## 2014-12-12 MED ORDER — DEXTROMETHORPHAN-BENZOCAINE 5-7.5 MG MT LOZG: 1.0000 | LOZENGE | Freq: Three times a day (TID) | OROMUCOSAL | Status: DC | PRN

## 2014-12-12 MED ORDER — PENICILLIN G BENZATHINE 1200000 UNIT/2ML IM SUSP
1.2000 10*6.[IU] | Freq: Once | INTRAMUSCULAR | Status: AC
  Administered 2014-12-12: 1.2 10*6.[IU] via INTRAMUSCULAR
  Filled 2014-12-12: qty 2

## 2014-12-12 MED ORDER — OXYCODONE-ACETAMINOPHEN 5-325 MG PO TABS: 2.0000 | ORAL_TABLET | ORAL | Status: DC | PRN

## 2014-12-12 MED ORDER — ACETAMINOPHEN 325 MG PO TABS
650.0000 mg | ORAL_TABLET | Freq: Once | ORAL | Status: AC
  Administered 2014-12-12: 650 mg via ORAL
  Filled 2014-12-12: qty 2

## 2014-12-12 MED ORDER — MAGIC MOUTHWASH
10.0000 mL | Freq: Once | ORAL | Status: AC
  Administered 2014-12-12: 10 mL via ORAL
  Filled 2014-12-12: qty 10

## 2014-12-12 NOTE — ED Notes (Signed)
Pt c/o of body aches, sore throat and fever starting this am

## 2014-12-12 NOTE — Discharge Instructions (Signed)

## 2014-12-12 NOTE — ED Provider Notes (Signed)
CSN: [id number]     Arrival date & time 12/12/14  1438 History  This chart was scribed for non-physician practitioner, Fayrene HelperBowie Saleh Ulbrich, PA-C, working with Mirian MoMatthew Gentry, MD, by Ronney LionSuzanne Le, ED Scribe. This patient was seen in room TR07C/TR07C and the patient's care was started at 3:36 PM.      Chief Complaint  Patient presents with  . Fever  . Sore Throat  . Generalized Body Aches   Patient is a 24 y.o. female presenting with fever and pharyngitis. The history is provided by the patient. No language interpreter was used.  Fever Associated symptoms: chills, myalgias and sore throat   Associated symptoms: no cough and no rhinorrhea   Sore Throat     HPI Comments: Kara Myers is a 24 y.o. female who presents to the Emergency Department complaining of sudden onset, severe generalized myalgias, fever, chills, and sore throat that began when patient woke up this morning. Patient slept all day, but denies any other treatment PTA. She denies having a PCP. She denies cough or rhinorrhea. She denies a possibility of pregnancy. She has NKDA. No recent sick contact.    Past Medical History  Diagnosis Date  . Herpes simplex     last outbreak in 2010  . Urinary tract infection   . Sickle cell trait    Past Surgical History  Procedure Laterality Date  . Dilation and curettage of uterus     Family History  Problem Relation Age of Onset  . Diabetes Mother   . Diabetes Father    History  Substance Use Topics  . Smoking status: Former Games developermoker  . Smokeless tobacco: Never Used  . Alcohol Use: No   OB History    Gravida Para Term Preterm AB TAB SAB Ectopic Multiple Living   3 2 2  1 1    2      Review of Systems  Constitutional: Positive for fever and chills.  HENT: Positive for sore throat. Negative for rhinorrhea.   Respiratory: Negative for cough.   Musculoskeletal: Positive for myalgias.      Allergies  Review of patient's allergies indicates no known allergies.  Home  Medications   Prior to Admission medications   Medication Sig Start Date End Date Taking? Authorizing Provider  cyclobenzaprine (FLEXERIL) 10 MG tablet Take 1 tablet (10 mg total) by mouth once. 01/18/14   Tommie SamsJayce G Cook, DO  ibuprofen (ADVIL,MOTRIN) 600 MG tablet Take 1 tablet (600 mg total) by mouth every 6 (six) hours. 01/24/14   Minta BalsamMichael R Odom, MD  Prenatal Vit-Fe Fumarate-FA (PRENATAL MULTIVITAMIN) TABS tablet Take 1 tablet by mouth daily at 12 noon.    Historical Provider, MD   BP 156/69 mmHg  Pulse 108  Temp(Src) 102.1 F (38.9 C) (Oral)  Resp 18  SpO2 95% Physical Exam  Constitutional: She is oriented to person, place, and time. She appears well-developed and well-nourished. No distress.  HENT:  Head: Normocephalic and atraumatic.  Right Ear: Tympanic membrane, external ear and ear canal normal.  Left Ear: Tympanic membrane, external ear and ear canal normal.  Mouth/Throat: Uvula is midline.  No tonsillar enlargement or exudate. No trismus.   Eyes: Conjunctivae and EOM are normal.  Neck: Neck supple. No tracheal deviation present.  Cardiovascular: Normal rate and regular rhythm.   Pulmonary/Chest: Effort normal and breath sounds normal. No respiratory distress. She has no wheezes. She has no rales.  Musculoskeletal: Normal range of motion. She exhibits tenderness.  Generalized tenderness throughout, with no focal  point tenderness.  Neurological: She is alert and oriented to person, place, and time.  Skin: Skin is warm and dry.  Psychiatric: She has a normal mood and affect. Her behavior is normal.  Nursing note and vitals reviewed.   ED Course  Procedures (including critical care time)  DIAGNOSTIC STUDIES: Oxygen Saturation is 95% on room air, normal by my interpretation.    COORDINATION OF CARE: 3:39 PM - Discussed treatment plan with pt at bedside  and symptom control medications, and pt agreed to plan.  4:24 PM Strep test positive.  Pen G 1.62mil unit IM given.   Tylenol given for fever, with defervescence.  Outpt treatment provided.  ENT referral given as needed.  No evidence of deep tissue infection.  Return precaution discussed.  Pt able to tolerates PO.     Labs Review Labs Reviewed  RAPID STREP SCREEN - Abnormal; Notable for the following:    Streptococcus, Group A Screen (Direct) POSITIVE (*)    All other components within normal limits  INFLUENZA PANEL BY PCR (TYPE A & B, H1N1)    Imaging Review No results found.   EKG Interpretation None      MDM   Final diagnoses:  Strep pharyngitis   BP 130/61 mmHg  Pulse 109  Temp(Src) 99 F (37.2 C) (Oral)  Resp 20  SpO2 99%   I personally performed the services described in this documentation, which was scribed in my presence. The recorded information has been reviewed and is accurate.     Fayrene Helper, PA-C 12/12/14 1626  Mirian Mo, MD 12/15/14 [phone]

## 2015-02-11 ENCOUNTER — Emergency Department (HOSPITAL_COMMUNITY)
Admission: EM | Admit: 2015-02-11 | Discharge: 2015-02-12 | Disposition: A | Discharge status: Home / Self Care | Payer: No Typology Code available for payment source | Attending: Emergency Medicine | Admitting: Emergency Medicine

## 2015-02-11 ENCOUNTER — Encounter (HOSPITAL_COMMUNITY): Payer: Self-pay

## 2015-02-11 DIAGNOSIS — Z79899 Other long term (current) drug therapy: Secondary | ICD-10-CM | POA: Diagnosis not present

## 2015-02-11 DIAGNOSIS — M545 Low back pain, unspecified: Secondary | ICD-10-CM

## 2015-02-11 DIAGNOSIS — Z87891 Personal history of nicotine dependence: Secondary | ICD-10-CM | POA: Diagnosis not present

## 2015-02-11 DIAGNOSIS — S6992XA Unspecified injury of left wrist, hand and finger(s), initial encounter: Secondary | ICD-10-CM | POA: Diagnosis not present

## 2015-02-11 DIAGNOSIS — Z3202 Encounter for pregnancy test, result negative: Secondary | ICD-10-CM | POA: Diagnosis not present

## 2015-02-11 DIAGNOSIS — Z87440 Personal history of urinary (tract) infections: Secondary | ICD-10-CM | POA: Insufficient documentation

## 2015-02-11 DIAGNOSIS — Y9389 Activity, other specified: Secondary | ICD-10-CM | POA: Insufficient documentation

## 2015-02-11 DIAGNOSIS — Y998 Other external cause status: Secondary | ICD-10-CM | POA: Diagnosis not present

## 2015-02-11 DIAGNOSIS — S199XXA Unspecified injury of neck, initial encounter: Secondary | ICD-10-CM | POA: Insufficient documentation

## 2015-02-11 DIAGNOSIS — S3992XA Unspecified injury of lower back, initial encounter: Secondary | ICD-10-CM | POA: Diagnosis not present

## 2015-02-11 DIAGNOSIS — Z8619 Personal history of other infectious and parasitic diseases: Secondary | ICD-10-CM | POA: Insufficient documentation

## 2015-02-11 DIAGNOSIS — Y92410 Unspecified street and highway as the place of occurrence of the external cause: Secondary | ICD-10-CM | POA: Diagnosis not present

## 2015-02-11 DIAGNOSIS — S99911A Unspecified injury of right ankle, initial encounter: Secondary | ICD-10-CM | POA: Insufficient documentation

## 2015-02-11 DIAGNOSIS — M542 Cervicalgia: Secondary | ICD-10-CM

## 2015-02-11 LAB — POC URINE PREG, ED: Preg Test, Ur: NEGATIVE

## 2015-02-11 MED ORDER — HYDROCODONE-ACETAMINOPHEN 5-325 MG PO TABS
1.0000 | ORAL_TABLET | Freq: Once | ORAL | Status: AC
  Administered 2015-02-11: 1 via ORAL
  Filled 2015-02-11: qty 1

## 2015-02-11 MED ORDER — IBUPROFEN 400 MG PO TABS
600.0000 mg | ORAL_TABLET | Freq: Once | ORAL | Status: AC
  Administered 2015-02-11: 600 mg via ORAL
  Filled 2015-02-11 (×2): qty 1

## 2015-02-11 MED ORDER — ONDANSETRON HCL 4 MG/2ML IJ SOLN
4.0000 mg | INTRAMUSCULAR | Status: AC
  Administered 2015-02-11: 4 mg via INTRAVENOUS
  Filled 2015-02-11: qty 2
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
## 2015-02-12 ENCOUNTER — Emergency Department (HOSPITAL_COMMUNITY): Payer: No Typology Code available for payment source

## 2015-02-12 DIAGNOSIS — S199XXA Unspecified injury of neck, initial encounter: Secondary | ICD-10-CM | POA: Diagnosis not present

## 2015-02-12 MED ORDER — NAPROXEN 500 MG PO TABS: 500.0000 mg | ORAL_TABLET | Freq: Two times a day (BID) | ORAL | Status: DC

## 2015-02-12 MED ORDER — HYDROCODONE-ACETAMINOPHEN 5-325 MG PO TABS: 1.0000 | ORAL_TABLET | ORAL | Status: DC | PRN

## 2015-02-12 NOTE — Discharge Instructions (Signed)
Please follow the directions provided. Be sure to follow-up with your primary care provider to ensure you are getting better.  Take the naproxen twice a day to help with inflammation.  Take the vicodin for pain not relieved by the naproxen.  You will be more sore tomorrow and that is to be expected.  Don't hesitate to return for any new, worsening or concerning symptoms.     SEEK IMMEDIATE MEDICAL CARE IF:  You have numbness, tingling, or weakness in the arms or legs.  You develop severe headaches not relieved with medicine.  You have severe neck pain, especially tenderness in the middle of the back of your neck.  You have changes in bowel or bladder control.  There is increasing pain in any area of the body.  You have shortness of breath, light-headedness, dizziness, or fainting.  You have chest pain.  You feel sick to your stomach (nauseous), throw up (vomit), or sweat.  You have increasing abdominal discomfort.  There is blood in your urine, stool, or vomit.  You have pain in your shoulder (shoulder strap areas).  You feel your symptoms are getting worse.

## 2015-02-12 NOTE — ED Notes (Signed)
Patient transported to X-ray 

## 2015-02-13 ENCOUNTER — Emergency Department (HOSPITAL_COMMUNITY)
Admission: EM | Admit: 2015-02-13 | Discharge: 2015-02-13 | Disposition: A | Discharge status: Home / Self Care | Payer: No Typology Code available for payment source | Attending: Emergency Medicine | Admitting: Emergency Medicine

## 2015-02-13 ENCOUNTER — Encounter (HOSPITAL_COMMUNITY): Payer: Self-pay | Admitting: *Deleted

## 2015-02-13 DIAGNOSIS — S3992XA Unspecified injury of lower back, initial encounter: Secondary | ICD-10-CM | POA: Diagnosis not present

## 2015-02-13 DIAGNOSIS — Z87891 Personal history of nicotine dependence: Secondary | ICD-10-CM | POA: Insufficient documentation

## 2015-02-13 DIAGNOSIS — Y998 Other external cause status: Secondary | ICD-10-CM | POA: Insufficient documentation

## 2015-02-13 DIAGNOSIS — S199XXA Unspecified injury of neck, initial encounter: Secondary | ICD-10-CM | POA: Diagnosis present

## 2015-02-13 DIAGNOSIS — Z87440 Personal history of urinary (tract) infections: Secondary | ICD-10-CM | POA: Insufficient documentation

## 2015-02-13 DIAGNOSIS — Z8619 Personal history of other infectious and parasitic diseases: Secondary | ICD-10-CM | POA: Diagnosis not present

## 2015-02-13 DIAGNOSIS — Y9389 Activity, other specified: Secondary | ICD-10-CM | POA: Diagnosis not present

## 2015-02-13 DIAGNOSIS — Z862 Personal history of diseases of the blood and blood-forming organs and certain disorders involving the immune mechanism: Secondary | ICD-10-CM | POA: Diagnosis not present

## 2015-02-13 DIAGNOSIS — M791 Myalgia, unspecified site: Secondary | ICD-10-CM

## 2015-02-13 DIAGNOSIS — S161XXA Strain of muscle, fascia and tendon at neck level, initial encounter: Secondary | ICD-10-CM | POA: Insufficient documentation

## 2015-02-13 DIAGNOSIS — Y92410 Unspecified street and highway as the place of occurrence of the external cause: Secondary | ICD-10-CM | POA: Diagnosis not present

## 2015-02-13 MED ORDER — OXYCODONE-ACETAMINOPHEN 5-325 MG PO TABS: 1.0000 | ORAL_TABLET | Freq: Four times a day (QID) | ORAL | Status: DC | PRN

## 2015-02-13 MED ORDER — OXYCODONE-ACETAMINOPHEN 5-325 MG PO TABS
2.0000 | ORAL_TABLET | Freq: Once | ORAL | Status: AC
  Administered 2015-02-13: 2 via ORAL
  Filled 2015-02-13: qty 2

## 2015-02-13 NOTE — ED Notes (Signed)
[facility].  States her arms are painful, etc.  Stated the pain meds did not work

## 2015-02-13 NOTE — Discharge Instructions (Signed)
Cervical Strain and Sprain (Whiplash) °with Rehab °Cervical strain and sprain are injuries that commonly occur with "whiplash" injuries. Whiplash occurs when the neck is forcefully whipped backward or forward, such as during a motor vehicle accident or during contact sports. The muscles, ligaments, tendons, discs, and nerves of the neck are susceptible to injury when this occurs. °RISK FACTORS °Risk of having a whiplash injury increases if: °· Osteoarthritis of the spine. °· Situations that make head or neck accidents or trauma more likely. °· High-risk sports (football, rugby, wrestling, hockey, auto racing, gymnastics, diving, contact karate, or boxing). °· Poor strength and flexibility of the neck. °· Previous neck injury. °· Poor tackling technique. °· Improperly fitted or padded equipment. °SYMPTOMS  °· Pain or stiffness in the front or back of neck or both. °· Symptoms may present immediately or up to 24 hours after injury. °· Dizziness, headache, nausea, and vomiting. °· Muscle spasm with soreness and stiffness in the neck. °· Tenderness and swelling at the injury site. °PREVENTION °· Learn and use proper technique (avoid tackling with the head, spearing, and head-butting; use proper falling techniques to avoid landing on the head). °· Warm up and stretch properly before activity. °· Maintain physical fitness: °· Strength, flexibility, and endurance. °· Cardiovascular fitness. °· Wear properly fitted and padded protective equipment, such as padded soft collars, for participation in contact sports. °PROGNOSIS  °Recovery from cervical strain and sprain injuries is dependent on the extent of the injury. These injuries are usually curable in 1 week to 3 months with appropriate treatment.  °RELATED COMPLICATIONS  °· Temporary numbness and weakness may occur if the nerve roots are damaged, and this may persist until the nerve has completely healed. °· Chronic pain due to frequent recurrence of  symptoms. °· Prolonged healing, especially if activity is resumed too soon (before complete recovery). °TREATMENT  °Treatment initially involves the use of ice and medication to help reduce pain and inflammation. It is also important to perform strengthening and stretching exercises and modify activities that worsen symptoms so the injury does not get worse. These exercises may be performed at home or with a therapist. For patients who experience severe symptoms, a soft, padded collar may be recommended to be worn around the neck.  °Improving your posture may help reduce symptoms. Posture improvement includes pulling your chin and abdomen in while sitting or standing. If you are sitting, sit in a firm chair with your buttocks against the back of the chair. While sleeping, try replacing your pillow with a small towel rolled to 2 inches in diameter, or use a cervical pillow or soft cervical collar. Poor sleeping positions delay healing.  °For patients with nerve root damage, which causes numbness or weakness, the use of a cervical traction apparatus may be recommended. Surgery is rarely necessary for these injuries. However, cervical strain and sprains that are present at birth (congenital) may require surgery. °MEDICATION  °· If pain medication is necessary, nonsteroidal anti-inflammatory medications, such as aspirin and ibuprofen, or other minor pain relievers, such as acetaminophen, are often recommended. °· Do not take pain medication for 7 days before surgery. °· Prescription pain relievers may be given if deemed necessary by your caregiver. Use only as directed and only as much as you need. °HEAT AND COLD:  °· Cold treatment (icing) relieves pain and reduces inflammation. Cold treatment should be applied for 10 to 15 minutes every 2 to 3 hours for inflammation and pain and immediately after any activity that aggravates   your symptoms. Use ice packs or an ice massage. °· Heat treatment may be used prior to  performing the stretching and strengthening activities prescribed by your caregiver, physical therapist, or athletic trainer. Use a heat pack or a warm soak. °SEEK MEDICAL CARE IF:  °· Symptoms get worse or do not improve in 2 weeks despite treatment. °· New, unexplained symptoms develop (drugs used in treatment may produce side effects). °EXERCISES °RANGE OF MOTION (ROM) AND STRETCHING EXERCISES - Cervical Strain and Sprain °These exercises may help you when beginning to rehabilitate your injury. In order to successfully resolve your symptoms, you must improve your posture. These exercises are designed to help reduce the forward-head and rounded-shoulder posture which contributes to this condition. Your symptoms may resolve with or without further involvement from your physician, physical therapist or athletic trainer. While completing these exercises, remember:  °· Restoring tissue flexibility helps normal motion to return to the joints. This allows healthier, less painful movement and activity. °· An effective stretch should be held for at least 20 seconds, although you may need to begin with shorter hold times for comfort. °· A stretch should never be painful. You should only feel a gentle lengthening or release in the stretched tissue. °STRETCH- Axial Extensors °· Lie on your back on the floor. You may bend your knees for comfort. Place a rolled-up hand towel or dish towel, about 2 inches in diameter, under the part of your head that makes contact with the floor. °· Gently tuck your chin, as if trying to make a "double chin," until you feel a gentle stretch at the base of your head. °· Hold __________ seconds. °Repeat __________ times. Complete this exercise __________ times per day.  °STRETCH - Axial Extension  °· Stand or sit on a firm surface. Assume a good posture: chest up, shoulders drawn back, abdominal muscles slightly tense, knees unlocked (if standing) and feet hip width apart. °· Slowly retract your  chin so your head slides back and your chin slightly lowers. Continue to look straight ahead. °· You should feel a gentle stretch in the back of your head. Be certain not to feel an aggressive stretch since this can cause headaches later. °· Hold for __________ seconds. °Repeat __________ times. Complete this exercise __________ times per day. °STRETCH - Cervical Side Bend  °· Stand or sit on a firm surface. Assume a good posture: chest up, shoulders drawn back, abdominal muscles slightly tense, knees unlocked (if standing) and feet hip width apart. °· Without letting your nose or shoulders move, slowly tip your right / left ear to your shoulder until your feel a gentle stretch in the muscles on the opposite side of your neck. °· Hold __________ seconds. °Repeat __________ times. Complete this exercise __________ times per day. °STRETCH - Cervical Rotators  °· Stand or sit on a firm surface. Assume a good posture: chest up, shoulders drawn back, abdominal muscles slightly tense, knees unlocked (if standing) and feet hip width apart. °· Keeping your eyes level with the ground, slowly turn your head until you feel a gentle stretch along the back and opposite side of your neck. °· Hold __________ seconds. °Repeat __________ times. Complete this exercise __________ times per day. °RANGE OF MOTION - Neck Circles  °· Stand or sit on a firm surface. Assume a good posture: chest up, shoulders drawn back, abdominal muscles slightly tense, knees unlocked (if standing) and feet hip width apart. °· Gently roll your head down and around from the   back of one shoulder to the back of the other. The motion should never be forced or painful. °· Repeat the motion 10-20 times, or until you feel the neck muscles relax and loosen. °Repeat __________ times. Complete the exercise __________ times per day. °STRENGTHENING EXERCISES - Cervical Strain and Sprain °These exercises may help you when beginning to rehabilitate your injury. They may  resolve your symptoms with or without further involvement from your physician, physical therapist, or athletic trainer. While completing these exercises, remember:  °· Muscles can gain both the endurance and the strength needed for everyday activities through controlled exercises. °· Complete these exercises as instructed by your physician, physical therapist, or athletic trainer. Progress the resistance and repetitions only as guided. °· You may experience muscle soreness or fatigue, but the pain or discomfort you are trying to eliminate should never worsen during these exercises. If this pain does worsen, stop and make certain you are following the directions exactly. If the pain is still present after adjustments, discontinue the exercise until you can discuss the trouble with your clinician. °STRENGTH - Cervical Flexors, Isometric °· Face a wall, standing about 6 inches away. Place a small pillow, a ball about 6-8 inches in diameter, or a folded towel between your forehead and the wall. °· Slightly tuck your chin and gently push your forehead into the soft object. Push only with mild to moderate intensity, building up tension gradually. Keep your jaw and forehead relaxed. °· Hold 10 to 20 seconds. Keep your breathing relaxed. °· Release the tension slowly. Relax your neck muscles completely before you start the next repetition. °Repeat __________ times. Complete this exercise __________ times per day. °STRENGTH- Cervical Lateral Flexors, Isometric  °· Stand about 6 inches away from a wall. Place a small pillow, a ball about 6-8 inches in diameter, or a folded towel between the side of your head and the wall. °· Slightly tuck your chin and gently tilt your head into the soft object. Push only with mild to moderate intensity, building up tension gradually. Keep your jaw and forehead relaxed. °· Hold 10 to 20 seconds. Keep your breathing relaxed. °· Release the tension slowly. Relax your neck muscles completely  before you start the next repetition. °Repeat __________ times. Complete this exercise __________ times per day. °STRENGTH - Cervical Extensors, Isometric  °· Stand about 6 inches away from a wall. Place a small pillow, a ball about 6-8 inches in diameter, or a folded towel between the back of your head and the wall. °· Slightly tuck your chin and gently tilt your head back into the soft object. Push only with mild to moderate intensity, building up tension gradually. Keep your jaw and forehead relaxed. °· Hold 10 to 20 seconds. Keep your breathing relaxed. °· Release the tension slowly. Relax your neck muscles completely before you start the next repetition. °Repeat __________ times. Complete this exercise __________ times per day. °POSTURE AND BODY MECHANICS CONSIDERATIONS - Cervical Strain and Sprain °Keeping correct posture when sitting, standing or completing your activities will reduce the stress put on different body tissues, allowing injured tissues a chance to heal and limiting painful experiences. The following are general guidelines for improved posture. Your physician or physical therapist will provide you with any instructions specific to your needs. While reading these guidelines, remember: °· The exercises prescribed by your provider will help you have the flexibility and strength to maintain correct postures. °· The correct posture provides the optimal environment for your joints to   work. All of your joints have less wear and tear when properly supported by a spine with good posture. This means you will experience a healthier, less painful body. °· Correct posture must be practiced with all of your activities, especially prolonged sitting and standing. Correct posture is as important when doing repetitive low-stress activities (typing) as it is when doing a single heavy-load activity (lifting). °PROLONGED STANDING WHILE SLIGHTLY LEANING FORWARD °When completing a task that requires you to lean  forward while standing in one place for a long time, place either foot up on a stationary 2- to 4-inch high object to help maintain the best posture. When both feet are on the ground, the low back tends to lose its slight inward curve. If this curve flattens (or becomes too large), then the back and your other joints will experience too much stress, fatigue more quickly, and can cause pain.  °RESTING POSITIONS °Consider which positions are most painful for you when choosing a resting position. If you have pain with flexion-based activities (sitting, bending, stooping, squatting), choose a position that allows you to rest in a less flexed posture. You would want to avoid curling into a fetal position on your side. If your pain worsens with extension-based activities (prolonged standing, working overhead), avoid resting in an extended position such as sleeping on your stomach. Most people will find more comfort when they rest with their spine in a more neutral position, neither too rounded nor too arched. Lying on a non-sagging bed on your side with a pillow between your knees, or on your back with a pillow under your knees will often provide some relief. Keep in mind, being in any one position for a prolonged period of time, no matter how correct your posture, can still lead to stiffness. °WALKING °Walk with an upright posture. Your ears, shoulders, and hips should all line up. °OFFICE WORK °When working at a desk, create an environment that supports good, upright posture. Without extra support, muscles fatigue and lead to excessive strain on joints and other tissues. °CHAIR: °· A chair should be able to slide under your desk when your back makes contact with the back of the chair. This allows you to work closely. °· The chair's height should allow your eyes to be level with the upper part of your monitor and your hands to be slightly lower than your elbows. °· Body position: °¨ Your feet should make contact with the  floor. If this is not possible, use a foot rest. °¨ Keep your ears over your shoulders. This will reduce stress on your neck and low back. °Document Released: 09/24/2005 Document Revised: 02/08/2014 Document Reviewed: 01/06/2009 °[employer]® Patient Information ©2015 [employer]. This information is not intended to replace advice given to you by your health care provider. Make sure you discuss any questions you have with your health care provider. °Motor Vehicle Collision °It is common to have multiple bruises and sore muscles after a motor vehicle collision (MVC). These tend to feel worse for the first 24 hours. You may have the most stiffness and soreness over the first several hours. You may also feel worse when you wake up the first morning after your collision. After this point, you will usually begin to improve with each day. The speed of improvement often depends on the severity of the collision, the number of injuries, and the location and nature of these injuries. °HOME CARE INSTRUCTIONS °· Put ice on the injured area. °¨ Put ice in a   plastic bag. °¨ Place a towel between your skin and the bag. °¨ Leave the ice on for 15-20 minutes, 3-4 times a day, or as directed by your health care provider. °· Drink enough fluids to keep your urine clear or pale yellow. Do not drink alcohol. °· Take a warm shower or bath once or twice a day. This will increase blood flow to sore muscles. °· You may return to activities as directed by your caregiver. Be careful when lifting, as this may aggravate neck or back pain. °· Only take over-the-counter or prescription medicines for pain, discomfort, or fever as directed by your caregiver. Do not use aspirin. This may increase bruising and bleeding. °SEEK IMMEDIATE MEDICAL CARE IF: °· You have numbness, tingling, or weakness in the arms or legs. °· You develop severe headaches not relieved with medicine. °· You have severe neck pain, especially tenderness in the middle of the back  of your neck. °· You have changes in bowel or bladder control. °· There is increasing pain in any area of the body. °· You have shortness of breath, light-headedness, dizziness, or fainting. °· You have chest pain. °· You feel sick to your stomach (nauseous), throw up (vomit), or sweat. °· You have increasing abdominal discomfort. °· There is blood in your urine, stool, or vomit. °· You have pain in your shoulder (shoulder strap areas). °· You feel your symptoms are getting worse. °MAKE SURE YOU: °· Understand these instructions. °· Will watch your condition. °· Will get help right away if you are not doing well or get worse. °Document Released: 09/24/2005 Document Revised: 02/08/2014 Document Reviewed: 02/21/2011 °[employer]® Patient Information ©2015 [employer]. This information is not intended to replace advice given to you by your health care provider. Make sure you discuss any questions you have with your health care provider. ° °
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
## 2015-09-29 ENCOUNTER — Encounter (HOSPITAL_COMMUNITY): Payer: Self-pay | Admitting: Emergency Medicine

## 2015-09-29 ENCOUNTER — Emergency Department (HOSPITAL_COMMUNITY)
Admission: EM | Admit: 2015-09-29 | Discharge: 2015-09-29 | Disposition: A | Discharge status: Home / Self Care | Payer: Medicaid Other | Attending: Emergency Medicine | Admitting: Emergency Medicine

## 2015-09-29 ENCOUNTER — Emergency Department (HOSPITAL_COMMUNITY): Payer: Medicaid Other

## 2015-09-29 DIAGNOSIS — Z87891 Personal history of nicotine dependence: Secondary | ICD-10-CM | POA: Insufficient documentation

## 2015-09-29 DIAGNOSIS — Z862 Personal history of diseases of the blood and blood-forming organs and certain disorders involving the immune mechanism: Secondary | ICD-10-CM | POA: Insufficient documentation

## 2015-09-29 DIAGNOSIS — Z791 Long term (current) use of non-steroidal anti-inflammatories (NSAID): Secondary | ICD-10-CM | POA: Insufficient documentation

## 2015-09-29 DIAGNOSIS — Z87440 Personal history of urinary (tract) infections: Secondary | ICD-10-CM | POA: Insufficient documentation

## 2015-09-29 DIAGNOSIS — S4991XA Unspecified injury of right shoulder and upper arm, initial encounter: Secondary | ICD-10-CM | POA: Insufficient documentation

## 2015-09-29 DIAGNOSIS — W1789XA Other fall from one level to another, initial encounter: Secondary | ICD-10-CM | POA: Insufficient documentation

## 2015-09-29 DIAGNOSIS — Y998 Other external cause status: Secondary | ICD-10-CM | POA: Insufficient documentation

## 2015-09-29 DIAGNOSIS — M25511 Pain in right shoulder: Secondary | ICD-10-CM

## 2015-09-29 DIAGNOSIS — Z8619 Personal history of other infectious and parasitic diseases: Secondary | ICD-10-CM | POA: Insufficient documentation

## 2015-09-29 DIAGNOSIS — Y9372 Activity, wrestling: Secondary | ICD-10-CM | POA: Insufficient documentation

## 2015-09-29 DIAGNOSIS — Y9289 Other specified places as the place of occurrence of the external cause: Secondary | ICD-10-CM | POA: Insufficient documentation

## 2015-09-29 DIAGNOSIS — Z79899 Other long term (current) drug therapy: Secondary | ICD-10-CM | POA: Insufficient documentation

## 2015-09-29 MED ORDER — TRAMADOL HCL 50 MG PO TABS: 50.0000 mg | ORAL_TABLET | Freq: Four times a day (QID) | ORAL | Status: DC | PRN

## 2015-09-29 MED ORDER — MORPHINE SULFATE (PF) 4 MG/ML IV SOLN
4.0000 mg | Freq: Once | INTRAVENOUS | Status: AC
  Administered 2015-09-29: 4 mg via INTRAMUSCULAR
  Filled 2015-09-29: qty 1

## 2015-09-29 NOTE — Discharge Instructions (Signed)
Shoulder Pain Take ibuprofen or Tylenol for pain and tramadol for breakthrough pain. Follow-up with orthopedics. Return for any numbness or tingling to the arm. The shoulder is the joint that connects your arms to your body. The bones that form the shoulder joint include the upper arm bone (humerus), the shoulder blade (scapula), and the collarbone (clavicle). The top of the humerus is shaped like a ball and fits into a rather flat socket on the scapula (glenoid cavity). A combination of muscles and strong, fibrous tissues that connect muscles to bones (tendons) support your shoulder joint and hold the ball in the socket. Small, fluid-filled sacs (bursae) are located in different areas of the joint. They act as cushions between the bones and the overlying soft tissues and help reduce friction between the gliding tendons and the bone as you move your arm. Your shoulder joint allows a wide range of motion in your arm. This range of motion allows you to do things like scratch your back or throw a ball. However, this range of motion also makes your shoulder more prone to pain from overuse and injury. Causes of shoulder pain can originate from both injury and overuse and usually can be grouped in the following four categories:  Redness, swelling, and pain (inflammation) of the tendon (tendinitis) or the bursae (bursitis).  Instability, such as a dislocation of the joint.  Inflammation of the joint (arthritis).  Broken bone (fracture). HOME CARE INSTRUCTIONS   Apply ice to the sore area.  Put ice in a plastic bag.  Place a towel between your skin and the bag.  Leave the ice on for 15-20 minutes, 3-4 times per day for the first 2 days, or as directed by your health care provider.  Stop using cold packs if they do not help with the pain.  If you have a shoulder sling or immobilizer, wear it as long as your caregiver instructs. Only remove it to shower or bathe. Move your arm as little as possible,  but keep your hand moving to prevent swelling.  Squeeze a soft ball or foam pad as much as possible to help prevent swelling.  Only take over-the-counter or prescription medicines for pain, discomfort, or fever as directed by your caregiver. SEEK MEDICAL CARE IF:   Your shoulder pain increases, or new pain develops in your arm, hand, or fingers.  Your hand or fingers become cold and numb.  Your pain is not relieved with medicines. SEEK IMMEDIATE MEDICAL CARE IF:   Your arm, hand, or fingers are numb or tingling.  Your arm, hand, or fingers are significantly swollen or turn white or blue. MAKE SURE YOU:   Understand these instructions.  Will watch your condition.  Will get help right away if you are not doing well or get worse.   This information is not intended to replace advice given to you by your health care provider. Make sure you discuss any questions you have with your health care provider.   Document Released: 07/04/2005 Document Revised: 10/15/2014 Document Reviewed: 01/17/2015 Elsevier Interactive Patient Education [employer]2016 Elsevier Inc.

## 2015-09-29 NOTE — ED Notes (Signed)
Pt from home for eval of right shoulder pain after fall today, no obvious deformity noted but pt unable to move right shoulder due to pain. Pulses present.

## 2015-09-29 NOTE — ED Provider Notes (Signed)
CSN: [id number]     Arrival date & time 09/29/15  1234 History  By signing my name below, I, Placido SouLogan Joldersma, attest that this documentation has been prepared under the direction and in the presence of Federated Department StoresHanna Patel-Mills, PA-C. Electronically Signed: Placido SouLogan Joldersma, ED Scribe. 09/29/2015. 1:21 PM.    Chief Complaint  Patient presents with  . Shoulder Pain   The history is provided by the patient. No language interpreter was used.    HPI Comments: Kara Myers is a 24 y.o. female who presents to the Emergency Department complaining of constant, moderate, right shoulder pain with onset earlier today. Pt notes that she was playing with her brother who picked her up and accidentally dropped her onto the floor. She notes worsening pain with any movement. She denies taking anything for her symptoms. Pt denies any numbness or tingling to the arm, or neck pain.  Past Medical History  Diagnosis Date  . Herpes simplex     last outbreak in 2010  . Urinary tract infection   . Sickle cell trait [facility](HCC)    Past Surgical History  Procedure Laterality Date  . Dilation and curettage of uterus     Family History  Problem Relation Age of Onset  . Diabetes Mother   . Diabetes Father    Social History  Substance Use Topics  . Smoking status: Former Games developermoker  . Smokeless tobacco: Never Used  . Alcohol Use: No   OB History    Gravida Para Term Preterm AB TAB SAB Ectopic Multiple Living   3 2 2  1 1    2      Review of Systems  Constitutional: Negative for fever.  Musculoskeletal: Positive for arthralgias.  Skin: Negative for wound.  Allergic/Immunologic: Negative for immunocompromised state.   Allergies  Review of patient's allergies indicates no known allergies.  Home Medications   Prior to Admission medications   Medication Sig Start Date End Date Taking? Authorizing Provider  cyclobenzaprine (FLEXERIL) 10 MG tablet Take 1 tablet (10 mg total) by mouth once. 01/18/14   Tommie SamsJayce G Cook, DO   Dextromethorphan-Benzocaine (CEPACOL SORE THROAT & COUGH) 5-7.5 MG LOZG Use as directed 1 lozenge in the mouth or throat every 8 (eight) hours as needed (sore throat). 12/12/14   Fayrene HelperBowie Tran, PA-C  HYDROcodone-acetaminophen (NORCO/VICODIN) 5-325 MG per tablet Take 1 tablet by mouth every 4 (four) hours as needed. 02/12/15   Harle BattiestElizabeth Tysinger, NP  ibuprofen (ADVIL,MOTRIN) 600 MG tablet Take 1 tablet (600 mg total) by mouth every 6 (six) hours. 01/24/14   Minta BalsamMichael R Odom, MD  naproxen (NAPROSYN) 500 MG tablet Take 1 tablet (500 mg total) by mouth 2 (two) times daily. 02/12/15   Harle BattiestElizabeth Tysinger, NP  oxyCODONE-acetaminophen (PERCOCET/ROXICET) 5-325 MG per tablet Take 1-2 tablets by mouth every 6 (six) hours as needed for severe pain. 02/13/15   Roxy Horsemanobert Browning, PA-C  Prenatal Vit-Fe Fumarate-FA (PRENATAL MULTIVITAMIN) TABS tablet Take 1 tablet by mouth daily at 12 noon.    Historical Provider, MD  traMADol (ULTRAM) 50 MG tablet Take 1 tablet (50 mg total) by mouth every 6 (six) hours as needed. 09/29/15   Madyson Lukach Patel-Mills, PA-C   BP 136/95 mmHg  Pulse 92  Temp(Src) 98 F (36.7 C) (Oral)  Resp 20  SpO2 99%  LMP 09/08/2015 Physical Exam  Constitutional: She is oriented to person, place, and time. She appears well-developed and well-nourished.  HENT:  Head: Normocephalic and atraumatic.  Mouth/Throat: No oropharyngeal exudate.  Neck: Normal range  of motion. No tracheal deviation present.  Cardiovascular: Normal rate.   Pulmonary/Chest: Effort normal. No respiratory distress.  Abdominal: Soft. There is no tenderness.  Musculoskeletal:  Right arm: no deformity; no clavicle pain; 5/5 grip strength; able to flex and extend elbow  Neurological: She is alert and oriented to person, place, and time.  Skin: Skin is warm and dry. She is not diaphoretic.  Psychiatric: She has a normal mood and affect. Her behavior is normal.  Nursing note and vitals reviewed.  ED Course  Procedures  DIAGNOSTIC  STUDIES: Oxygen Saturation is 99% on RA, normal by my interpretation.    COORDINATION OF CARE: 12:55 PM Pt presents today due to right shoulder pain. Discussed next steps with pt including a DG of the affected shoulder and reevaluation based on results of the imaging.   Labs Review Labs Reviewed - No data to display  Imaging Review Dg Shoulder Right  09/29/2015  CLINICAL DATA:  Fall while wrestling.  Right shoulder pain. EXAM: RIGHT SHOULDER - 2+ VIEW COMPARISON:  None. FINDINGS: There is no evidence of fracture or dislocation. There is no evidence of arthropathy or other focal bone abnormality. Soft tissues are unremarkable. IMPRESSION: Negative. Electronically Signed   By: Charlett Nose M.D.   On: 09/29/2015 13:44   I have personally reviewed and evaluated these images as part of my medical decision-making.   EKG Interpretation None      MDM   Final diagnoses:  Shoulder pain, acute, right   Patient presents for right shoulder injury. Patient X-Ray negative for obvious fracture or dislocation.  Pt advised to follow up with orthopedics. Patient given shoulder immobilizer while in ED, conservative therapy recommended and discussed. Patient will be discharged home & is agreeable with above plan. Returns precautions discussed. Pt appears safe for discharge.  Recheck: now that pt has had pain medication she is able to abduct the arm to approximately 45 degrees but is not able to actively reach  Filed Vitals:   09/29/15 1247 09/29/15 1402  BP: 157/93 136/95  Pulse: 87 92  Temp: 98.2 F (36.8 C) 98 F (36.7 C)  Resp: 14 20   Medications  morphine 4 MG/ML injection 4 mg (4 mg Intramuscular Given 09/29/15 1300)  I personally performed the services described in this documentation, which was scribed in my presence. The recorded information has been reviewed and is accurate.    Catha Gosselin, PA-C 09/29/15 1445  Rolan Bucco, MD 09/29/15 1500

## 2015-09-29 NOTE — ED Notes (Signed)
See pa note.  

## 2016-06-02 ENCOUNTER — Ambulatory Visit (HOSPITAL_COMMUNITY)
Admission: EM | Admit: 2016-06-02 | Discharge: 2016-06-02 | Disposition: A | Discharge status: Home / Self Care | Payer: Medicaid Other | Attending: Emergency Medicine | Admitting: Emergency Medicine

## 2016-06-02 ENCOUNTER — Encounter (HOSPITAL_COMMUNITY): Payer: Self-pay | Admitting: Emergency Medicine

## 2016-06-02 DIAGNOSIS — G44209 Tension-type headache, unspecified, not intractable: Secondary | ICD-10-CM

## 2016-06-02 DIAGNOSIS — G43009 Migraine without aura, not intractable, without status migrainosus: Secondary | ICD-10-CM

## 2016-06-02 MED ORDER — DEXAMETHASONE SODIUM PHOSPHATE 10 MG/ML IJ SOLN
10.0000 mg | Freq: Once | INTRAMUSCULAR | Status: AC
  Administered 2016-06-02: 10 mg via INTRAMUSCULAR

## 2016-06-02 MED ORDER — METOCLOPRAMIDE HCL 5 MG/ML IJ SOLN
5.0000 mg | Freq: Once | INTRAMUSCULAR | Status: AC
  Administered 2016-06-02: 5 mg via INTRAMUSCULAR

## 2016-06-02 MED ORDER — KETOROLAC TROMETHAMINE 60 MG/2ML IM SOLN
INTRAMUSCULAR | Status: AC
  Filled 2016-06-02: qty 2

## 2016-06-02 MED ORDER — DEXAMETHASONE SODIUM PHOSPHATE 10 MG/ML IJ SOLN
INTRAMUSCULAR | Status: AC
  Filled 2016-06-02: qty 1

## 2016-06-02 MED ORDER — METOCLOPRAMIDE HCL 5 MG/ML IJ SOLN
INTRAMUSCULAR | Status: AC
  Filled 2016-06-02: qty 2

## 2016-06-02 MED ORDER — KETOROLAC TROMETHAMINE 60 MG/2ML IM SOLN
60.0000 mg | Freq: Once | INTRAMUSCULAR | Status: AC
  Administered 2016-06-02: 60 mg via INTRAMUSCULAR

## 2016-06-02 NOTE — ED Provider Notes (Signed)
CSN: [id number]     Arrival date & time 06/02/16  1254 History   First MD Initiated Contact with Patient 06/02/16 1534     Chief Complaint  Patient presents with  . Headache   (Consider location/radiation/quality/duration/timing/severity/associated sxs/prior Treatment) 25 year old female complaining of a headache that developed last night. She states it woke her from her sleep. Shortly after she developed a headache she had one episode of vomiting followed by another episode later in the morning. The headache pain is located across the lower forehead and around the eyes. Denies pain or stiffness to the neck. Associated symptoms include the nausea and vomiting that occurred earlier since has completely abated, seeing spots in the eyes at the onset of the headache but has completely abated. Denies problems with vision, speech, hearing, swallowing, focal paresthesias or weakness, dizziness, neck pain or stiffness. Denies fever, chills, earache, sore throat or arthralgias. She states that she does not have a history of similar intensity headaches but has had similar headaches in the past. This is just worse than the previous ones but not the worst headache of her life. She is fully awake and alert, speech is lucid and articulate. Memory and recall intact. Exposure to light makes it worse. Nothing makes it better. She has taken Advil today which has not helped much. No hx of trauma, recent ETOH or other meds. No previous dx of migraine.      Past Medical History:  Diagnosis Date  . Herpes simplex    last outbreak in 2010  . Sickle cell trait (HCC)   . Urinary tract infection    Past Surgical History:  Procedure Laterality Date  . DILATION AND CURETTAGE OF UTERUS     Family History  Problem Relation Age of Onset  . Diabetes Mother   . Diabetes Father    Social History  Substance Use Topics  . Smoking status: Former Games developer  . Smokeless tobacco: Never Used  . Alcohol use No   OB  History    Gravida Para Term Preterm AB Living   3 2 2   1 2    SAB TAB Ectopic Multiple Live Births     1     2     Review of Systems  Constitutional: Positive for activity change. Negative for fatigue and fever.  Eyes: Positive for photophobia. Negative for visual disturbance.       Complains of pain around her eyes.  Respiratory: Negative.   Cardiovascular: Negative.  Negative for chest pain.  Gastrointestinal: Positive for nausea and vomiting.  Genitourinary: Negative.   Musculoskeletal: Negative.   Skin: Negative.   Neurological: Positive for headaches. Negative for dizziness, tremors, seizures, syncope, facial asymmetry, speech difficulty, weakness, light-headedness and numbness.  Psychiatric/Behavioral: Negative.   All other systems reviewed and are negative.   Allergies  Review of patient's allergies indicates no known allergies.  Home Medications   Prior to Admission medications   Medication Sig Start Date End Date Taking? Authorizing Provider  ibuprofen (ADVIL,MOTRIN) 600 MG tablet Take 1 tablet (600 mg total) by mouth every 6 (six) hours. 01/24/14  Yes [name], MD  cyclobenzaprine (FLEXERIL) 10 MG tablet Take 1 tablet (10 mg total) by mouth once. 01/18/14   Tommie Sams, DO  Dextromethorphan-Benzocaine (CEPACOL SORE THROAT & COUGH) 5-7.5 MG LOZG Use as directed 1 lozenge in the mouth or throat every 8 (eight) hours as needed (sore throat). 12/12/14   Fayrene Helper, PA-C  HYDROcodone-acetaminophen (NORCO/VICODIN) 5-325 MG per tablet  Take 1 tablet by mouth every 4 (four) hours as needed. 02/12/15   Harle BattiestElizabeth Tysinger, NP  naproxen (NAPROSYN) 500 MG tablet Take 1 tablet (500 mg total) by mouth 2 (two) times daily. 02/12/15   Harle BattiestElizabeth Tysinger, NP  Prenatal Vit-Fe Fumarate-FA (PRENATAL MULTIVITAMIN) TABS tablet Take 1 tablet by mouth daily at 12 noon.    Historical Provider, MD   Meds Ordered and Administered this Visit   Medications  dexamethasone (DECADRON) injection 10 mg  (not administered)  ketorolac (TORADOL) injection 60 mg (not administered)  metoCLOPramide (REGLAN) injection 5 mg (not administered)    BP 131/80 (BP Location: Left Arm)   Pulse 63   Temp 98.7 F (37.1 C) (Oral)   Resp 16   SpO2 96%  No data found.   Physical Exam  Constitutional: She is oriented to person, place, and time. She appears well-developed and well-nourished. No distress.  HENT:  Head: Normocephalic and atraumatic.  Right Ear: External ear normal.  Left Ear: External ear normal.  Mouth/Throat: Oropharynx is clear and moist. No oropharyngeal exudate.  Tenderness to the muscles of the jaw.  Eyes: Conjunctivae and EOM are normal. Pupils are equal, round, and reactive to light. Right eye exhibits no discharge. Left eye exhibits no discharge.  Neck: Normal range of motion. Neck supple.  Tenderness to the bilateral trapezius and splenius capitis muscles. No tenderness to the cervical spine. Demonstrates full flexion, extension and range of motion of the neck.  Cardiovascular: Normal rate, regular rhythm, normal heart sounds and intact distal pulses.   Pulmonary/Chest: Effort normal and breath sounds normal. No respiratory distress.  Abdominal: Soft. There is no tenderness.  Musculoskeletal: Normal range of motion. She exhibits no edema or tenderness.  Lymphadenopathy:    She has no cervical adenopathy.  Neurological: She is alert and oriented to person, place, and time. She has normal strength. She displays no tremor. No cranial nerve deficit or sensory deficit. She exhibits normal muscle tone. Coordination and gait normal. GCS eye subscore is 4. GCS verbal subscore is 5. GCS motor subscore is 6.  Reflex Scores:      Patellar reflexes are 2+ on the right side and 2+ on the left side. Skin: Skin is warm and dry. No rash noted.  Psychiatric: She has a normal mood and affect. Her speech is normal and behavior is normal. Judgment and thought content normal. She is not actively  hallucinating. Cognition and memory are normal. She is attentive.  Nursing note and vitals reviewed.   Urgent Care Course   Clinical Course    Procedures (including critical care time)  Labs Review Labs Reviewed - No data to display  Imaging Review No results found.   Visual Acuity Review  Right Eye Distance:   Left Eye Distance:   Bilateral Distance:    Right Eye Near:   Left Eye Near:    Bilateral Near:         MDM   1. 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

## 2016-06-02 NOTE — ED Notes (Signed)
Patients discharge was placed too early, patient needed 3 injections prior to discharge and patient requested a work note at [employer], np agreed to prepare work note.

## 2016-06-02 NOTE — ED Triage Notes (Signed)
Headache and general body aches.   Reports vomiting liquids since last night.  Vomited once this morning.  Last bm -"that I dont know" "not today or yesterday"  No diarrhea.   Patient yawning as she is talking to me .  Reports eyes are sore too

## 2016-06-02 NOTE — ED Notes (Signed)
Dimmed lights and provided warm blankets

## 2016-06-02 NOTE — Discharge Instructions (Signed)
When you get home take 50 mg of Benadryl, go to bed and rest. For worsening or new symptoms go to the ED or call 911

## 2016-09-17 ENCOUNTER — Emergency Department (HOSPITAL_COMMUNITY)
Admission: EM | Admit: 2016-09-17 | Discharge: 2016-09-18 | Disposition: A | Discharge status: Home / Self Care | Payer: Medicaid Other | Attending: Emergency Medicine | Admitting: Emergency Medicine

## 2016-09-17 ENCOUNTER — Encounter (HOSPITAL_COMMUNITY): Payer: Self-pay

## 2016-09-17 DIAGNOSIS — Z87891 Personal history of nicotine dependence: Secondary | ICD-10-CM | POA: Insufficient documentation

## 2016-09-17 DIAGNOSIS — R112 Nausea with vomiting, unspecified: Secondary | ICD-10-CM

## 2016-09-17 LAB — CBC
HCT: 39.7 % (ref 36.0–46.0)
Hemoglobin: 13.4 g/dL (ref 12.0–15.0)
MCH: 28.8 pg (ref 26.0–34.0)
MCHC: 33.8 g/dL (ref 30.0–36.0)
MCV: 85.4 fL (ref 78.0–100.0)
PLATELETS: 429 10*3/uL — AB (ref 150–400)
RBC: 4.65 MIL/uL (ref 3.87–5.11)
RDW: 12.3 % (ref 11.5–15.5)
WBC: 7.9 10*3/uL (ref 4.0–10.5)

## 2016-09-17 LAB — COMPREHENSIVE METABOLIC PANEL
ALK PHOS: 61 U/L (ref 38–126)
ALT: 34 U/L (ref 14–54)
AST: 45 U/L — AB (ref 15–41)
Albumin: 4.1 g/dL (ref 3.5–5.0)
Anion gap: 11 (ref 5–15)
BUN: 8 mg/dL (ref 6–20)
CALCIUM: 9.4 mg/dL (ref 8.9–10.3)
CO2: 25 mmol/L (ref 22–32)
CREATININE: 0.71 mg/dL (ref 0.44–1.00)
Chloride: 104 mmol/L (ref 101–111)
GFR calc non Af Amer: >60 mL/min (ref >60)
Glucose, Bld: 74 mg/dL (ref 65–99)
Potassium: 4.1 mmol/L (ref 3.5–5.1)
SODIUM: 140 mmol/L (ref 135–145)
Total Bilirubin: 0.7 mg/dL (ref 0.3–1.2)
Total Protein: 7.7 g/dL (ref 6.5–8.1)

## 2016-09-17 LAB — LIPASE, BLOOD: Lipase: 14 U/L (ref 11–51)

## 2016-09-17 MED ORDER — ONDANSETRON 4 MG PO TBDP
ORAL_TABLET | ORAL | Status: AC
  Administered 2016-09-17: 4 mg
  Filled 2016-09-17: qty 1

## 2016-09-17 NOTE — ED Triage Notes (Signed)
Pt endorses n/v that began this morning when she woke up. Pt states "I think I drunk too much alcohol last night" Denies diarrhea or urinary sx. Pt dry heaving in triage.

## 2016-09-17 NOTE — ED Provider Notes (Signed)
[facility]-EMERGENCY DEPT Provider Note   CSN: [id number] Arrival date & time: 09/17/16  1937  By signing my name below, I, Vista Minkobert Ross, attest that this documentation has been prepared under the direction and in the presence of Zadie Rhineonald Kassidie Hendriks, MD. Electronically signed, Vista Minkobert Ross, ED Scribe. 09/17/16. 11:17 PM.   History   Chief Complaint Chief Complaint  Patient presents with  . Nausea  . Emesis    HPI HPI Comments: Kara Myers is a 25 y.o. female, who presents to the Emergency Department complaining of persistent nausea and vomiting that started this morning. Pt estimates that she had 10-12 episodes of yellow/green vomit today. Pt was drinking Tequila yesterday and believes she may have drank too much. Today, she has attempted to drink fluids to rehydrate but reports that she has not been able to keep them down. She denies any abdominal pain, dysuria, vaginal discharge. No diarrhea, fever, cough.  The history is provided by the patient. No language interpreter was used.    Past Medical History:  Diagnosis Date  . Herpes simplex    last outbreak in 2010  . Sickle cell trait (HCC)   . Urinary tract infection     Patient Active Problem List   Diagnosis Date Noted  . PROM (premature rupture of membranes) 01/21/2014    Past Surgical History:  Procedure Laterality Date  . DILATION AND CURETTAGE OF UTERUS      OB History    Gravida Para Term Preterm AB Living   3 2 2   1 2    SAB TAB Ectopic Multiple Live Births     1     2       Home Medications    Prior to Admission medications   Medication Sig Start Date End Date Taking? Authorizing Provider  acetaminophen (TYLENOL) 325 MG tablet Take 650 mg by mouth every 6 (six) hours as needed for mild pain or headache.   Yes Historical Provider, MD  cyclobenzaprine (FLEXERIL) 10 MG tablet Take 1 tablet (10 mg total) by mouth once. Patient not taking: Reported on 09/17/2016 01/18/14   Tommie SamsJayce G Cook, DO    Dextromethorphan-Benzocaine (CEPACOL SORE THROAT & COUGH) 5-7.5 MG LOZG Use as directed 1 lozenge in the mouth or throat every 8 (eight) hours as needed (sore throat). Patient not taking: Reported on 09/17/2016 12/12/14   Fayrene HelperBowie Tran, PA-C  HYDROcodone-acetaminophen (NORCO/VICODIN) 5-325 MG per tablet Take 1 tablet by mouth every 4 (four) hours as needed. Patient not taking: Reported on 09/17/2016 02/12/15   Harle BattiestElizabeth Tysinger, NP  ibuprofen (ADVIL,MOTRIN) 600 MG tablet Take 1 tablet (600 mg total) by mouth every 6 (six) hours. Patient not taking: Reported on 09/17/2016 01/24/14   Minta BalsamMichael R Odom, MD  naproxen (NAPROSYN) 500 MG tablet Take 1 tablet (500 mg total) by mouth 2 (two) times daily. Patient not taking: Reported on 09/17/2016 02/12/15   Harle BattiestElizabeth Tysinger, NP  Prenatal Vit-Fe Fumarate-FA (PRENATAL MULTIVITAMIN) TABS tablet Take 1 tablet by mouth daily at 12 noon.    Historical Provider, MD    Family History Family History  Problem Relation Age of Onset  . Diabetes Mother   . Diabetes Father     Social History Social History  Substance Use Topics  . Smoking status: Former Games developermoker  . Smokeless tobacco: Never Used  . Alcohol use Yes     Allergies   Patient has no known allergies.   Review of Systems Review of Systems  Constitutional: Negative for fever.  HENT: Negative  for sore throat.   Respiratory: Negative for cough.   Cardiovascular: Negative for chest pain.  Gastrointestinal: Positive for nausea and vomiting. Negative for blood in stool and diarrhea.  Genitourinary: Negative for dysuria and vaginal discharge.  Musculoskeletal: Negative for arthralgias.  All other systems reviewed and are negative.    Physical Exam Updated Vital Signs BP 161/92   Pulse 67   Temp 98.4 F (36.9 C) (Oral)   Resp 18   Ht 5\' 4"  (1.626 m)   Wt 219 lb (99.3 kg)   LMP 09/12/2016 (Exact Date)   SpO2 99%   Breastfeeding? No   BMI 37.59 kg/m   Physical Exam CONSTITUTIONAL: Well  developed/well nourished HEAD: Normocephalic/atraumatic EYES: EOMI/PERRL ENMT: Mucous membranes moist NECK: supple no meningeal signs SPINE/BACK:entire spine nontender CV: S1/S2 noted, no murmurs/rubs/gallops noted LUNGS: Lungs are clear to auscultation bilaterally, no apparent distress ABDOMEN: soft, nontender, no rebound or guarding, bowel sounds noted throughout abdomen GU:no cva tenderness NEURO: Pt is awake/alert/appropriate, moves all extremitiesx4.  No facial droop.   EXTREMITIES: pulses normal/equal, full ROM SKIN: warm, color normal PSYCH: no abnormalities of mood noted, alert and oriented to situation   ED Treatments / Results  DIAGNOSTIC STUDIES: Oxygen Saturation is 99% on RA, normal by my interpretation.  COORDINATION OF CARE: 11:11 PM-Discussed treatment plan with pt at bedside and pt agreed to plan.   Labs (all labs ordered are listed, but only abnormal results are displayed) Labs Reviewed  COMPREHENSIVE METABOLIC PANEL - Abnormal; Notable for the following:       Result Value   AST 45 (*)    All other components within normal limits  CBC - Abnormal; Notable for the following:    Platelets 429 (*)    All other components within normal limits  URINALYSIS, ROUTINE W REFLEX MICROSCOPIC - Abnormal; Notable for the following:    APPearance HAZY (*)    All other components within normal limits  LIPASE, BLOOD  POC URINE PREG, ED    EKG  EKG Interpretation None       Radiology No results found.  Procedures Procedures (including critical care time)  Medications Ordered in ED Medications  ondansetron (ZOFRAN-ODT) 4 MG disintegrating tablet (4 mg  Given 09/17/16 2021)     Initial Impression / Assessment and Plan / ED Course  I have reviewed the triage vital signs and the nursing notes.  Pertinent labs results that were available during my care of the patient were reviewed by me and considered in my medical decision making (see chart for  details).  Clinical Course     Pt improved She is taking PO Labs reassuring No ABD pain reported D/c home  Final Clinical Impressions(s) / ED Diagnoses   Final diagnoses:  Non-intractable vomiting with nausea, unspecified vomiting type    New Prescriptions Discharge Medication List as of 09/18/2016  2:03 AM    START taking these medications   Details  ondansetron (ZOFRAN ODT) 8 MG disintegrating tablet 8mg  ODT q4 hours prn nausea, Print      I personally performed the services described in this documentation, which was scribed in my presence. The recorded information has been reviewed and is accurate.       Zadie Rhineonald Toyoko Silos, MD 09/18/16 [phone]

## 2016-09-18 LAB — URINALYSIS, ROUTINE W REFLEX MICROSCOPIC
BILIRUBIN URINE: NEGATIVE
GLUCOSE, UA: NEGATIVE mg/dL
HGB URINE DIPSTICK: NEGATIVE
KETONES UR: NEGATIVE mg/dL
Leukocytes, UA: NEGATIVE
Nitrite: NEGATIVE
PROTEIN: NEGATIVE mg/dL
Specific Gravity, Urine: 1.019 (ref 1.005–1.030)
pH: 6 (ref 5.0–8.0)

## 2016-09-18 LAB — POC URINE PREG, ED: Preg Test, Ur: NEGATIVE

## 2016-09-18 MED ORDER — ONDANSETRON 8 MG PO TBDP: ORAL_TABLET | ORAL | 0 refills | Status: DC

## 2016-09-18 NOTE — Discharge Instructions (Signed)
°  SEEK IMMEDIATE MEDICAL ATTENTION IF: °A temperature above 100.4F develops.  °Repeated vomiting occurs (multiple episodes).  °The pain becomes localized to portions of the abdomen. The right side could possibly be appendicitis. In an adult, the left lower portion of the abdomen could be colitis or diverticulitis.  °Blood is being passed in stools or vomit (bright red or black tarry stools).  °Return also if you develop chest pain, difficulty breathing, dizziness or fainting, or become confused, poorly responsive, or inconsolable. ° °

## 2016-11-05 ENCOUNTER — Encounter (HOSPITAL_COMMUNITY): Payer: Self-pay

## 2016-11-05 ENCOUNTER — Inpatient Hospital Stay (HOSPITAL_COMMUNITY): Payer: Self-pay

## 2016-11-05 ENCOUNTER — Inpatient Hospital Stay (HOSPITAL_COMMUNITY)
Admission: AD | Admit: 2016-11-05 | Discharge: 2016-11-05 | Disposition: A | Discharge status: Home / Self Care | Payer: Self-pay | Source: Ambulatory Visit | Attending: Family Medicine | Admitting: Family Medicine

## 2016-11-05 DIAGNOSIS — D573 Sickle-cell trait: Secondary | ICD-10-CM | POA: Insufficient documentation

## 2016-11-05 DIAGNOSIS — Z3490 Encounter for supervision of normal pregnancy, unspecified, unspecified trimester: Secondary | ICD-10-CM

## 2016-11-05 DIAGNOSIS — O208 Other hemorrhage in early pregnancy: Secondary | ICD-10-CM | POA: Insufficient documentation

## 2016-11-05 DIAGNOSIS — Z87891 Personal history of nicotine dependence: Secondary | ICD-10-CM | POA: Insufficient documentation

## 2016-11-05 DIAGNOSIS — O209 Hemorrhage in early pregnancy, unspecified: Secondary | ICD-10-CM

## 2016-11-05 DIAGNOSIS — Z3A01 Less than 8 weeks gestation of pregnancy: Secondary | ICD-10-CM | POA: Insufficient documentation

## 2016-11-05 LAB — CBC
HCT: 35.8 % — ABNORMAL LOW (ref 36.0–46.0)
Hemoglobin: 12.1 g/dL (ref 12.0–15.0)
MCH: 28.4 pg (ref 26.0–34.0)
MCHC: 33.8 g/dL (ref 30.0–36.0)
MCV: 84 fL (ref 78.0–100.0)
PLATELETS: 354 10*3/uL (ref 150–400)
RBC: 4.26 MIL/uL (ref 3.87–5.11)
RDW: 12.6 % (ref 11.5–15.5)
WBC: 9.1 10*3/uL (ref 4.0–10.5)

## 2016-11-05 LAB — URINALYSIS, ROUTINE W REFLEX MICROSCOPIC
BACTERIA UA: NONE SEEN
Bilirubin Urine: NEGATIVE
Glucose, UA: NEGATIVE mg/dL
Hgb urine dipstick: NEGATIVE
KETONES UR: NEGATIVE mg/dL
Nitrite: NEGATIVE
PH: 7 (ref 5.0–8.0)
PROTEIN: NEGATIVE mg/dL
Specific Gravity, Urine: 1.011 (ref 1.005–1.030)

## 2016-11-05 LAB — WET PREP, GENITAL
Sperm: NONE SEEN
TRICH WET PREP: NONE SEEN
Yeast Wet Prep HPF POC: NONE SEEN

## 2016-11-05 LAB — HCG, QUANTITATIVE, PREGNANCY: HCG, BETA CHAIN, QUANT, S: 2089 m[IU]/mL — AB (ref <5)

## 2016-11-05 LAB — POCT PREGNANCY, URINE: Preg Test, Ur: POSITIVE — AB

## 2016-11-05 NOTE — Discharge Instructions (Signed)
Prenatal Care Providers °[facility]    [facility] ° Phone- [phone]     Phone: [phone] °         °[facility]                      [facility] ° @[place]     Phone: [phone] ° Phone: [phone] °        [facility] °[facility]     Phone: [phone] ° Phone: [phone]   °        [facility] °[facility] [place]                hone: [phone] ° Phone: [phone] °        [facility] °Dr. Bernard Marshall      Phone: [phone] ° Phone: [phone] °        [facility] °[facility]                Phone: [phone] ° Women’s Health  ° Phone:[phone]    [facility] ([place]) °         Phone: [phone] °[facility] °  Phone: [phone] ° °Safe Medications in Pregnancy  ° °Acne: °Benzoyl Peroxide °Salicylic Acid ° °Backache/Headache: °Tylenol: 2 regular strength every 4 hours OR °             2 Extra strength every 6 hours ° °Colds/Coughs/Allergies: °Benadryl (alcohol free) 25 mg every 6 hours as needed °Breath right strips °Claritin °Cepacol throat lozenges °Chloraseptic throat spray °Cold-Eeze- up to three times per day °Cough drops, alcohol free °Flonase (by prescription only) °Guaifenesin °Mucinex °Robitussin DM (plain only, alcohol free) °Saline nasal spray/drops °Sudafed (pseudoephedrine) & Actifed ** use only after [redacted] weeks gestation and if you do not have high blood pressure °Tylenol °Vicks Vaporub °Zinc lozenges °Zyrtec  ° °Constipation: °Colace °Ducolax suppositories °Fleet enema °Glycerin suppositories °Metamucil °Milk of magnesia °Miralax °Senokot °Smooth move tea ° °Diarrhea: °Kaopectate °Imodium A-D ° °*NO pepto Bismol ° °Hemorrhoids: °Anusol °Anusol HC °Preparation H °Tucks ° °Indigestion: °Tums °Maalox °Mylanta °Zantac  °Pepcid ° °Insomnia: °Benadryl (alcohol free) 25mg every 6 hours as needed °Tylenol  PM °Unisom, no Gelcaps ° °Leg Cramps: °Tums °MagGel ° °Nausea/Vomiting:  °Bonine °Dramamine °Emetrol °Ginger extract °Sea bands °Meclizine  °Nausea medication to take during pregnancy:  °Unisom (doxylamine succinate 25 mg tablets) Take one tablet daily at bedtime. If symptoms are not adequately controlled, the dose can be increased to a maximum recommended dose of two tablets daily (1/2 tablet in the morning, 1/2 tablet mid-afternoon and one at bedtime). °Vitamin B6 100mg tablets. Take one tablet twice a day (up to 200 mg per day). ° °Skin Rashes: °Aveeno products °Benadryl cream or 25mg every 6 hours as needed °Calamine Lotion °1% cortisone cream ° °Yeast infection: °Gyne-lotrimin 7 °Monistat 7 ° ° °**If taking multiple medications, please check labels to avoid duplicating the same active ingredients °**take medication as directed on the label °** Do not exceed 4000 mg of tylenol in 24 hours °**Do not take medications that contain aspirin or ibuprofen ° ° ° ° °First Trimester of Pregnancy °The first trimester of pregnancy is from week 1 until the end of week 12 (months 1 through 3). A week after a sperm fertilizes an egg, the egg will implant on the wall of the uterus.   This embryo will begin to develop into a baby. Genes from you and your partner are forming the baby. The female genes determine whether the baby is a boy or a girl. At 6-8 weeks, the eyes and face are formed, and the heartbeat can be seen on ultrasound. At the end of 12 weeks, all the baby's organs are formed.  °Now that you are pregnant, you will want to do everything you can to have a healthy baby. Two of the most important things are to get good prenatal care and to follow your health care provider's instructions. Prenatal care is all the medical care you receive before the baby's birth. This care will help prevent, find, and treat any problems during the pregnancy and childbirth. °BODY CHANGES °Your body goes through many changes during pregnancy.  The changes vary from woman to woman.  °· You may gain or lose a couple of pounds at first. °· You may feel sick to your stomach (nauseous) and throw up (vomit). If the vomiting is uncontrollable, call your health care provider. °· You may tire easily. °· You may develop headaches that can be relieved by medicines approved by your health care provider. °· You may urinate more often. Painful urination may mean you have a bladder infection. °· You may develop heartburn as a result of your pregnancy. °· You may develop constipation because certain hormones are causing the muscles that push waste through your intestines to slow down. °· You may develop hemorrhoids or swollen, bulging veins (varicose veins). °· Your breasts may begin to grow larger and become tender. Your nipples may stick out more, and the tissue that surrounds them (areola) may become darker. °· Your gums may bleed and may be sensitive to brushing and flossing. °· Dark spots or blotches (chloasma, mask of pregnancy) may develop on your face. This will likely fade after the baby is born. °· Your menstrual periods will stop. °· You may have a loss of appetite. °· You may develop cravings for certain kinds of food. °· You may have changes in your emotions from day to day, such as being excited to be pregnant or being concerned that something may go wrong with the pregnancy and baby. °· You may have more vivid and strange dreams. °· You may have changes in your hair. These can include thickening of your hair, rapid growth, and changes in texture. Some women also have hair loss during or after pregnancy, or hair that feels dry or thin. Your hair will most likely return to normal after your baby is born. °WHAT TO EXPECT AT YOUR PRENATAL VISITS °During a routine prenatal visit: °· You will be weighed to make sure you and the baby are growing normally. °· Your blood pressure will be taken. °· Your abdomen will be measured to track your baby's growth. °· The  fetal heartbeat will be listened to starting around week 10 or 12 of your pregnancy. °· Test results from any previous visits will be discussed. °Your health care provider may ask you: °· How you are feeling. °· If you are feeling the baby move. °· If you have had any abnormal symptoms, such as leaking fluid, bleeding, severe headaches, or abdominal cramping. °· If you are using any tobacco products, including cigarettes, chewing tobacco, and electronic cigarettes. °· If you have any questions. °Other tests that may be performed during your first trimester include: °· Blood tests to find your blood type and to check for the presence of any   previous infections. They will also be used to check for low iron levels (anemia) and Rh antibodies. Later in the pregnancy, blood tests for diabetes will be done along with other tests if problems develop. °· Urine tests to check for infections, diabetes, or protein in the urine. °· An ultrasound to confirm the proper growth and development of the baby. °· An amniocentesis to check for possible genetic problems. °· Fetal screens for spina bifida and Down syndrome. °· You may need other tests to make sure you and the baby are doing well. °· HIV (human immunodeficiency virus) testing. Routine prenatal testing includes screening for HIV, unless you choose not to have this test. °HOME CARE INSTRUCTIONS  °Medicines  °· Follow your health care provider's instructions regarding medicine use. Specific medicines may be either safe or unsafe to take during pregnancy. °· Take your prenatal vitamins as directed. °· If you develop constipation, try taking a stool softener if your health care provider approves. °Diet  °· Eat regular, well-balanced meals. Choose a variety of foods, such as meat or vegetable-based protein, fish, milk and low-fat dairy products, vegetables, fruits, and whole grain breads and cereals. Your health care provider will help you determine the amount of weight gain that  is right for you. °· Avoid raw meat and uncooked cheese. These carry germs that can cause birth defects in the baby. °· Eating four or five small meals rather than three large meals a day may help relieve nausea and vomiting. If you start to feel nauseous, eating a few soda crackers can be helpful. Drinking liquids between meals instead of during meals also seems to help nausea and vomiting. °· If you develop constipation, eat more high-fiber foods, such as fresh vegetables or fruit and whole grains. Drink enough fluids to keep your urine clear or pale yellow. °Activity and Exercise  °· Exercise only as directed by your health care provider. Exercising will help you: °¨ Control your weight. °¨ Stay in shape. °¨ Be prepared for labor and delivery. °· Experiencing pain or cramping in the lower abdomen or low back is a good sign that you should stop exercising. Check with your health care provider before continuing normal exercises. °· Try to avoid standing for long periods of time. Move your legs often if you must stand in one place for a long time. °· Avoid heavy lifting. °· Wear low-heeled shoes, and practice good posture. °· You may continue to have sex unless your health care provider directs you otherwise. °Relief of Pain or Discomfort  °· Wear a good support bra for breast tenderness.   °· Take warm sitz baths to soothe any pain or discomfort caused by hemorrhoids. Use hemorrhoid cream if your health care provider approves.   °· Rest with your legs elevated if you have leg cramps or low back pain. °· If you develop varicose veins in your legs, wear support hose. Elevate your feet for 15 minutes, 3-4 times a day. Limit salt in your diet. °Prenatal Care  °· Schedule your prenatal visits by the twelfth week of pregnancy. They are usually scheduled monthly at first, then more often in the last 2 months before delivery. °· Write down your questions. Take them to your prenatal visits. °· Keep all your prenatal visits  as directed by your health care provider. °Safety  °· Wear your seat belt at all times when driving. °· Make a list of emergency phone numbers, including numbers for family, friends, the hospital, and police and fire departments. °  General Tips  °· Ask your health care provider for a referral to a local prenatal education class. Begin classes no later than at the beginning of month 6 of your pregnancy. °· Ask for help if you have counseling or nutritional needs during pregnancy. Your health care provider can offer advice or refer you to specialists for help with various needs. °· Do not use hot tubs, steam rooms, or saunas. °· Do not douche or use tampons or scented sanitary pads. °· Do not cross your legs for long periods of time. °· Avoid cat litter boxes and soil used by cats. These carry germs that can cause birth defects in the baby and possibly loss of the fetus by miscarriage or stillbirth. °· Avoid all smoking, herbs, alcohol, and medicines not prescribed by your health care provider. Chemicals in these affect the formation and growth of the baby. °· Do not use any tobacco products, including cigarettes, chewing tobacco, and electronic cigarettes. If you need help quitting, ask your health care provider. You may receive counseling support and other resources to help you quit. °· Schedule a dentist appointment. At home, brush your teeth with a soft toothbrush and be gentle when you floss. °SEEK MEDICAL CARE IF:  °· You have dizziness. °· You have mild pelvic cramps, pelvic pressure, or nagging pain in the abdominal area. °· You have persistent nausea, vomiting, or diarrhea. °· You have a bad smelling vaginal discharge. °· You have pain with urination. °· You notice increased swelling in your face, hands, legs, or ankles. °SEEK IMMEDIATE MEDICAL CARE IF:  °· You have a fever. °· You are leaking fluid from your vagina. °· You have spotting or bleeding from your vagina. °· You have severe abdominal cramping or  pain. °· You have rapid weight gain or loss. °· You vomit blood or material that looks like coffee grounds. °· You are exposed to German measles and have never had them. °· You are exposed to fifth disease or chickenpox. °· You develop a severe headache. °· You have shortness of breath. °· You have any kind of trauma, such as from a fall or a car accident. °This information is not intended to replace advice given to you by your health care provider. Make sure you discuss any questions you have with your health care provider. °Document Released: 09/18/2001 Document Revised: 10/15/2014 Document Reviewed: 08/04/2013 °Elsevier Interactive Patient Education © 2017 [employer] ° ° °

## 2016-11-05 NOTE — MAU Provider Note (Signed)
History     CSN: [id number]  Arrival date and time: 11/05/16 [id number]   First Provider Initiated Contact with Patient 11/05/16 2031      Chief Complaint  Patient presents with  . Vaginal Bleeding   Vaginal Bleeding  The patient's primary symptoms include vaginal bleeding. The patient's pertinent negatives include no pelvic pain. This is a new problem. Episode onset: about 3 days ago. The problem occurs intermittently. The problem has been gradually improving. The patient is experiencing no pain. She is pregnant. Pertinent negatives include no chills, dysuria, fever, nausea, urgency or vomiting. The vaginal discharge was bloody. Vaginal bleeding amount: "was like a period for one day, and then lighter" She has not been passing clots. She has not been passing tissue. Nothing aggravates the symptoms. She has tried nothing for the symptoms. Her menstrual history has been regular (LMP 10/01/16 ).     Past Medical History:  Diagnosis Date  . Herpes simplex    last outbreak in 2010  . Sickle cell trait (HCC)   . Urinary tract infection     Past Surgical History:  Procedure Laterality Date  . DILATION AND CURETTAGE OF UTERUS      Family History  Problem Relation Age of Onset  . Diabetes Mother   . Diabetes Father     Social History  Substance Use Topics  . Smoking status: Former Games developermoker  . Smokeless tobacco: Never Used  . Alcohol use Yes    Allergies: No Known Allergies  Prescriptions Prior to Admission  Medication Sig Dispense Refill Last Dose  . acetaminophen (TYLENOL) 325 MG tablet Take 650 mg by mouth every 6 (six) hours as needed for mild pain or headache.   09/17/2016 at Unknown time  . ibuprofen (ADVIL,MOTRIN) 600 MG tablet Take 1 tablet (600 mg total) by mouth every 6 (six) hours. (Patient not taking: Reported on 09/17/2016) 30 tablet 0 Not Taking at Unknown time  . naproxen (NAPROSYN) 500 MG tablet Take 1 tablet (500 mg total) by mouth 2 (two) times daily. (Patient not  taking: Reported on 09/17/2016) 30 tablet 0 Not Taking at Unknown time  . ondansetron (ZOFRAN ODT) 8 MG disintegrating tablet 8mg  ODT q4 hours prn nausea 4 tablet 0   . Prenatal Vit-Fe Fumarate-FA (PRENATAL MULTIVITAMIN) TABS tablet Take 1 tablet by mouth daily at 12 noon.   Not Taking at Unknown time    Review of Systems  Constitutional: Negative for chills and fever.  Gastrointestinal: Negative for nausea and vomiting.  Genitourinary: Positive for vaginal bleeding. Negative for dysuria, pelvic pain and urgency.   Physical Exam   Blood pressure 166/86, pulse 86, temperature 98.2 F (36.8 C), resp. rate 18, height 5\' 4"  (1.626 m), weight 220 lb (99.8 kg), last menstrual period 08/22/2016.  Physical Exam  Nursing note and vitals reviewed. Constitutional: She is oriented to person, place, and time. She appears well-developed and well-nourished. No distress.  HENT:  Head: Normocephalic.  Cardiovascular: Normal rate.   Respiratory: Effort normal.  GI: Soft. There is no tenderness. There is no rebound.  Neurological: She is alert and oriented to person, place, and time.  Skin: Skin is warm and dry.  Psychiatric: She has a normal mood and affect.     Results for orders placed or performed during the hospital encounter of 11/05/16 (from the past 24 hour(s))  Urinalysis, Routine w reflex microscopic     Status: Abnormal   Collection Time: 11/05/16  6:50 PM  Result Value Ref Range  Color, Urine STRAW (A) YELLOW   APPearance CLEAR CLEAR   Specific Gravity, Urine 1.011 1.005 - 1.030   pH 7.0 5.0 - 8.0   Glucose, UA NEGATIVE NEGATIVE mg/dL   Hgb urine dipstick NEGATIVE NEGATIVE   Bilirubin Urine NEGATIVE NEGATIVE   Ketones, ur NEGATIVE NEGATIVE mg/dL   Protein, ur NEGATIVE NEGATIVE mg/dL   Nitrite NEGATIVE NEGATIVE   Leukocytes, UA SMALL (A) NEGATIVE   RBC / HPF 0-5 0 - 5 RBC/hpf   WBC, UA 0-5 0 - 5 WBC/hpf   Bacteria, UA NONE SEEN NONE SEEN   Squamous Epithelial / LPF 0-5 (A) NONE  SEEN  Pregnancy, urine POC     Status: Abnormal   Collection Time: 11/05/16  6:59 PM  Result Value Ref Range   Preg Test, Ur POSITIVE (A) NEGATIVE  Wet prep, genital     Status: Abnormal   Collection Time: 11/05/16  8:35 PM  Result Value Ref Range   Yeast Wet Prep HPF POC NONE SEEN NONE SEEN   Trich, Wet Prep NONE SEEN NONE SEEN   Clue Cells Wet Prep HPF POC PRESENT (A) NONE SEEN   WBC, Wet Prep HPF POC MANY (A) NONE SEEN   Sperm NONE SEEN   CBC     Status: Abnormal   Collection Time: 11/05/16  8:56 PM  Result Value Ref Range   WBC 9.1 4.0 - 10.5 K/uL   RBC 4.26 3.87 - 5.11 MIL/uL   Hemoglobin 12.1 12.0 - 15.0 g/dL   HCT 32.9 (L) 51.8 - 84.1 %   MCV 84.0 78.0 - 100.0 fL   MCH 28.4 26.0 - 34.0 pg   MCHC 33.8 30.0 - 36.0 g/dL   RDW 66.0 63.0 - 16.0 %   Platelets 354 150 - 400 K/uL   US Ob Comp Less 14 Wks  Result Date: 11/05/2016 CLINICAL DATA:  Acute onset of vaginal bleeding.  Initial encounter. EXAM: OBSTETRIC <14 WK [place] AND TRANSVAGINAL OB US TECHNIQUE: Both transabdominal and transvaginal ultrasound examinations were performed for complete evaluation of the gestation as well as the maternal uterus, adnexal regions, and pelvic cul-de-sac. Transvaginal technique was performed to assess early pregnancy. COMPARISON:  Pelvic ultrasound performed 12/01/2013 FINDINGS: Intrauterine gestational sac: Single; visualized and normal in shape. Yolk sac:  Yes Embryo:  No Cardiac Activity: N/A MSD:  6 mm   5 w   2 d Subchorionic hemorrhage: A moderate amount of subchorionic hemorrhage is noted. Maternal uterus/adnexae: The uterus is otherwise unremarkable in appearance. The right ovary is unremarkable in appearance, measuring approximately 2.8 x 0.7 x 1.8 cm. The left ovary is not visualized on this study. No suspicious adnexal masses are seen. No free fluid is seen within the pelvic cul-de-sac. IMPRESSION: 1. Single intrauterine gestational sac, measuring 6 mm in mean sac diameter, corresponding to a  gestational age of [redacted] weeks 2 days. A yolk sac is seen. No embryo is yet visualized. This does not match the gestational age by LMP, but it remains too early to determine a new estimated date of delivery. If the patient's quantitative beta HCG level continues to trend upward, would perform follow-up pelvic ultrasound in 2 weeks for further evaluation. 2. Moderate amount of subchorionic hemorrhage noted. Electronically Signed   By: Roanna Raider M.D.   On: 11/05/2016 21:46   US Ob Transvaginal  Result Date: 11/05/2016 CLINICAL DATA:  Acute onset of vaginal bleeding.  Initial encounter. EXAM: OBSTETRIC <14 WK [place] AND TRANSVAGINAL OB US TECHNIQUE: Both transabdominal  and transvaginal ultrasound examinations were performed for complete evaluation of the gestation as well as the maternal uterus, adnexal regions, and pelvic cul-de-sac. Transvaginal technique was performed to assess early pregnancy. COMPARISON:  Pelvic ultrasound performed 12/01/2013 FINDINGS: Intrauterine gestational sac: Single; visualized and normal in shape. Yolk sac:  Yes Embryo:  No Cardiac Activity: N/A MSD:  6 mm   5 w   2 d Subchorionic hemorrhage: A moderate amount of subchorionic hemorrhage is noted. Maternal uterus/adnexae: The uterus is otherwise unremarkable in appearance. The right ovary is unremarkable in appearance, measuring approximately 2.8 x 0.7 x 1.8 cm. The left ovary is not visualized on this study. No suspicious adnexal masses are seen. No free fluid is seen within the pelvic cul-de-sac. IMPRESSION: 1. Single intrauterine gestational sac, measuring 6 mm in mean sac diameter, corresponding to a gestational age of [redacted] weeks 2 days. A yolk sac is seen. No embryo is yet visualized. This does not match the gestational age by LMP, but it remains too early to determine a new estimated date of delivery. If the patient's quantitative beta HCG level continues to trend upward, would perform follow-up pelvic ultrasound in 2 weeks for further  evaluation. 2. Moderate amount of subchorionic hemorrhage noted. Electronically Signed   By: Roanna Raider M.D.   On: 11/05/2016 21:46     MAU Course  Procedures  MDM   Assessment and Plan   1. [redacted] weeks gestation of pregnancy   2. Vaginal bleeding in pregnancy, first trimester   3. Intrauterine pregnancy    DC home Comfort measures reviewed  1st Trimester precautions  Bleeding precautions RX: none  Return to MAU as needed FU with OB as planned  Follow-up Information    [facility] Follow up.   Contact information: [address] [place] [address] [phone]            Tawnya Crook 11/05/2016, 8:38 PM

## 2016-11-05 NOTE — MAU Note (Signed)
Pt presents to MAU with complaints of vaginal bleeding that started on Friday and lasted until mid day Saturday. Positive pregnancy test on Sunday . Denies any pain

## 2016-11-06 LAB — HIV ANTIBODY (ROUTINE TESTING W REFLEX): HIV Screen 4th Generation wRfx: NONREACTIVE

## 2016-11-06 LAB — GC/CHLAMYDIA PROBE AMP (~~LOC~~) NOT AT ARMC
Chlamydia: NEGATIVE
Neisseria Gonorrhea: NEGATIVE

## 2016-11-06 LAB — RPR: RPR Ser Ql: NONREACTIVE

## 2017-09-30 ENCOUNTER — Encounter (HOSPITAL_COMMUNITY): Payer: Self-pay

## 2018-02-13 ENCOUNTER — Inpatient Hospital Stay (HOSPITAL_COMMUNITY)
Admission: AD | Admit: 2018-02-13 | Discharge: 2018-02-13 | Disposition: A | Discharge status: Home / Self Care | Payer: Self-pay | Source: Ambulatory Visit | Attending: Obstetrics & Gynecology | Admitting: Obstetrics & Gynecology

## 2018-02-13 DIAGNOSIS — B9689 Other specified bacterial agents as the cause of diseases classified elsewhere: Secondary | ICD-10-CM

## 2018-02-13 DIAGNOSIS — Z87891 Personal history of nicotine dependence: Secondary | ICD-10-CM | POA: Insufficient documentation

## 2018-02-13 DIAGNOSIS — Z79899 Other long term (current) drug therapy: Secondary | ICD-10-CM | POA: Insufficient documentation

## 2018-02-13 DIAGNOSIS — N760 Acute vaginitis: Secondary | ICD-10-CM | POA: Insufficient documentation

## 2018-02-13 DIAGNOSIS — Z9889 Other specified postprocedural states: Secondary | ICD-10-CM | POA: Insufficient documentation

## 2018-02-13 DIAGNOSIS — N3000 Acute cystitis without hematuria: Secondary | ICD-10-CM

## 2018-02-13 DIAGNOSIS — N898 Other specified noninflammatory disorders of vagina: Secondary | ICD-10-CM

## 2018-02-13 DIAGNOSIS — D573 Sickle-cell trait: Secondary | ICD-10-CM | POA: Insufficient documentation

## 2018-02-13 LAB — URINALYSIS, ROUTINE W REFLEX MICROSCOPIC
BILIRUBIN URINE: NEGATIVE
Glucose, UA: NEGATIVE mg/dL
Hgb urine dipstick: NEGATIVE
KETONES UR: NEGATIVE mg/dL
Nitrite: POSITIVE — AB
PH: 5 (ref 5.0–8.0)
Protein, ur: NEGATIVE mg/dL
Specific Gravity, Urine: 1.018 (ref 1.005–1.030)

## 2018-02-13 LAB — POCT PREGNANCY, URINE: PREG TEST UR: NEGATIVE

## 2018-02-13 LAB — WET PREP, GENITAL
Sperm: NONE SEEN
Trich, Wet Prep: NONE SEEN
Yeast Wet Prep HPF POC: NONE SEEN

## 2018-02-13 MED ORDER — METRONIDAZOLE 500 MG PO TABS: 500.0000 mg | ORAL_TABLET | Freq: Two times a day (BID) | ORAL | 0 refills | Status: AC

## 2018-02-13 MED ORDER — CEPHALEXIN 500 MG PO CAPS: 500.0000 mg | ORAL_CAPSULE | Freq: Two times a day (BID) | ORAL | 0 refills | Status: AC

## 2018-02-13 MED ORDER — FLUCONAZOLE 150 MG PO TABS: 150.0000 mg | ORAL_TABLET | Freq: Once | ORAL | 2 refills | Status: AC

## 2018-02-13 NOTE — Discharge Instructions (Signed)
For recurrent BV try: Vaginal boric acid 600 mg nightly for three weeks, then twice weekly for 6 months to suppress BV.  To be used after the initial seven day course of oral Metronidazole.   Can also take a probiotic daily to help with suppression.  Treating you for BV and a UTI.  Bacterial Vaginosis Bacterial vaginosis is an infection of the vagina. It happens when too many germs (bacteria) grow in the vagina. This infection puts you at risk for infections from sex (STIs). Treating this infection can lower your risk for some STIs. You should also treat this if you are pregnant. It can cause your baby to be born early. Follow these instructions at home: Medicines  Take over-the-counter and prescription medicines only as told by your doctor.  Take or use your antibiotic medicine as told by your doctor. Do not stop taking or using it even if you start to feel better. General instructions  If you your sexual partner is a woman, tell her that you have this infection. She needs to get treatment if she has symptoms. If you have a female partner, he does not need to be treated.  During treatment: ? Avoid sex. ? Do not douche. ? Avoid alcohol as told. ? Avoid breastfeeding as told.  Drink enough fluid to keep your pee (urine) clear or pale yellow.  Keep your vagina and butt (rectum) clean. ? Wash the area with warm water every day. ? Wipe from front to back after you use the toilet.  Keep all follow-up visits as told by your doctor. This is important. Preventing this condition  Do not douche.  Use only warm water to wash around your vagina.  Use protection when you have sex. This includes: ? Latex condoms. ? Dental dams.  Limit how many people you have sex with. It is best to only have sex with the same person (be monogamous).  Get tested for STIs. Have your partner get tested.  Wear underwear that is cotton or lined with cotton.  Avoid tight pants and pantyhose. This is most  important in summer.  Do not use any products that have nicotine or tobacco in them. These include cigarettes and e-cigarettes. If you need help quitting, ask your doctor.  Do not use illegal drugs.  Limit how much alcohol you drink. Contact a doctor if:  Your symptoms do not get better, even after you are treated.  You have more discharge or pain when you pee (urinate).  You have a fever.  You have pain in your belly (abdomen).  You have pain with sex.  Your bleed from your vagina between periods. Summary  This infection happens when too many germs (bacteria) grow in the vagina.  Treating this condition can lower your risk for some infections from sex (STIs).  You should also treat this if you are pregnant. It can cause early (premature) birth.  Do not stop taking or using your antibiotic medicine even if you start to feel better. This information is not intended to replace advice given to you by your health care provider. Make sure you discuss any questions you have with your health care provider. Document Released: 07/03/2008 Document Revised: 06/09/2016 Document Reviewed: 06/09/2016 Elsevier Interactive Patient Education  2017 [employer].

## 2018-02-13 NOTE — MAU Provider Note (Signed)
Kara Myers  for mild pain or headache.    . ondansetron (ZOFRAN ODT) 8 MG disintegrating tablet  ODT q4 hours prn nausea 4 tablet 0  . Prenatal Vit-Fe Fumarate-FA (PRENATAL MULTIVITAMIN) TABS tablet Take 1 tablet by mouth daily at 12 noon.     No Known Allergies  I have reviewed the past Medical Hx, Surgical Hx, Social Hx, Allergies and Medications.   REVIEW OF SYSTEMS All systems reviewed and are negative for acute change except as noted in the HPI.   OBJECTIVE BP 126/86 (BP Location: Right Arm)   Pulse 72   Temp 98.6 F (37 C) (Oral)   Resp 18   Ht  (1.626 m)   Wt 108.9 kg (240 lb)   SpO2 99%   BMI 41.20 kg/m    PHYSICAL EXAM Constitutional: Well-developed, well-nourished female in no acute distress.  Cardiovascular: normal rate and rhythm, pulses intact Respiratory: normal rate and effort.  GI: Abd soft, non-tender, non-distended. MS: Extremities nontender, no edema, normal ROM Neurologic: Alert and oriented x 4. No focal deficits Pelvic: deferred  Psych: normal mood and affect  LAB  RESULTS Results for orders placed or performed during the hospital encounter of 02/13/18 (from the past 24 hour(s))  Urinalysis, Routine w reflex microscopic     Status: Abnormal   Collection Time: 02/13/18  8:46 PM  Result Value Ref Range   Color, Urine YELLOW YELLOW   APPearance HAZY (A) CLEAR   Specific Gravity, Urine 1.018 1.005 - 1.030   pH 5.0 5.0 - 8.0   Glucose, UA NEGATIVE NEGATIVE mg/dL   Hgb urine dipstick NEGATIVE NEGATIVE   Bilirubin Urine NEGATIVE NEGATIVE   Ketones, ur NEGATIVE NEGATIVE mg/dL   Protein, ur NEGATIVE NEGATIVE mg/dL   Nitrite POSITIVE (A) NEGATIVE   Leukocytes, UA MODERATE (A) NEGATIVE   RBC / HPF 0-5 0 - 5 RBC/hpf   WBC, UA 21-50 0 - 5 WBC/hpf   Bacteria, UA RARE (A) NONE SEEN   Squamous Epithelial / LPF 0-5 0 - 5   Mucus PRESENT   Wet prep, genital     Status: Abnormal   Collection Time: 02/13/18  9:05 PM  Result Value Ref Range   Yeast Wet Prep HPF POC NONE SEEN NONE SEEN   Trich, Wet Prep NONE SEEN NONE SEEN   Clue Cells Wet Prep HPF POC PRESENT (A) NONE SEEN   WBC, Wet Prep HPF POC FEW (A) NONE SEEN   Sperm NONE SEEN   Pregnancy, urine POC     Status: None   Collection Time: 02/13/18  9:26 PM  Result Value Ref Range   Preg Test, Ur NEGATIVE NEGATIVE    IMAGING No results found.  MAU Management/MDM: Vitals and nursing notes reviewed Orders Placed This Encounter  Procedures  . Wet prep, genital  . Urinalysis, Routine w reflex microscopic  . Pregnancy, urine POC    No orders of the defined types were placed in this encounter.  Wet prep with signs of BV. Will treat with Flagyl. Discussed suppressive therapy options with patient to include boric acid regimen. Also discussed use of probiotic. Patient also with UTI. Will treat as well.   Plan of care reviewed with patient, including labs and tests ordered and medical treatment.   ASSESSMENT 1. Acute cystitis without hematuria   2. Vaginal discharge   3. Bacterial vaginosis      PLAN Discharge home in stable condition. Counseled on return precautions Handout given   Allergies as of 02/13/2018  No Known Allergies     Medication List    TAKE these medications   acetaminophen 325 MG tablet Commonly known as:  TYLENOL Take 650 mg by mouth every 6 (six) hours as needed for mild pain or headache.   cephALEXin 500 MG capsule Commonly known as:  KEFLEX Take 1 capsule (500 mg total) by mouth 2 (two) times daily for 7 days.   metroNIDAZOLE 500 MG tablet Commonly known as:  FLAGYL Take 1 tablet (500 mg total) by mouth 2 (two) times daily for 7 days.   ondansetron 8 MG disintegrating tablet Commonly known as:  ZOFRAN ODT  ODT q4 hours prn nausea   prenatal multivitamin Tabs tablet Take 1 tablet by mouth daily at 12 noon.     ASK your doctor about these medications   fluconazole 150 MG tablet Commonly known as:  DIFLUCAN Take 1 tablet (150 mg total) by mouth once for 1 dose. Ask about: Should I take this medication?       Caryl Ada, DO [facility], [facility] 02/13/2018, 9:49 PM

## 2018-02-13 NOTE — MAU Note (Signed)
Pt reports vaginal discharge with an odor. States she was dx with BV at HD in February and state she gets it all the time. Pt denies pain or burning with urination. Pt does report some frequency with urination. Pt denies vaginal bleeding. LMP: 01/06/2018

## 2018-02-14 LAB — GC/CHLAMYDIA PROBE AMP (~~LOC~~) NOT AT ARMC
Chlamydia: NEGATIVE
Neisseria Gonorrhea: NEGATIVE

## 2018-03-09 ENCOUNTER — Inpatient Hospital Stay (HOSPITAL_COMMUNITY)
Admission: AD | Admit: 2018-03-09 | Discharge: 2018-03-09 | Disposition: A | Discharge status: Home / Self Care | Payer: Self-pay | Source: Ambulatory Visit | Attending: Family Medicine | Admitting: Family Medicine

## 2018-03-09 ENCOUNTER — Other Ambulatory Visit: Payer: Self-pay

## 2018-03-09 ENCOUNTER — Encounter (HOSPITAL_COMMUNITY): Payer: Self-pay | Admitting: *Deleted

## 2018-03-09 DIAGNOSIS — R5383 Other fatigue: Secondary | ICD-10-CM | POA: Insufficient documentation

## 2018-03-09 DIAGNOSIS — Z9889 Other specified postprocedural states: Secondary | ICD-10-CM | POA: Insufficient documentation

## 2018-03-09 DIAGNOSIS — Z833 Family history of diabetes mellitus: Secondary | ICD-10-CM | POA: Insufficient documentation

## 2018-03-09 DIAGNOSIS — Z79899 Other long term (current) drug therapy: Secondary | ICD-10-CM | POA: Insufficient documentation

## 2018-03-09 DIAGNOSIS — F17200 Nicotine dependence, unspecified, uncomplicated: Secondary | ICD-10-CM | POA: Insufficient documentation

## 2018-03-09 DIAGNOSIS — D573 Sickle-cell trait: Secondary | ICD-10-CM | POA: Insufficient documentation

## 2018-03-09 LAB — URINALYSIS, ROUTINE W REFLEX MICROSCOPIC
BILIRUBIN URINE: NEGATIVE
GLUCOSE, UA: NEGATIVE mg/dL
HGB URINE DIPSTICK: NEGATIVE
Ketones, ur: NEGATIVE mg/dL
NITRITE: NEGATIVE
Protein, ur: NEGATIVE mg/dL
Specific Gravity, Urine: 1.011 (ref 1.005–1.030)
pH: 7 (ref 5.0–8.0)

## 2018-03-09 LAB — POCT PREGNANCY, URINE: PREG TEST UR: POSITIVE — AB

## 2018-03-09 MED ORDER — PRENATAL VITAMIN 27-0.8 MG PO TABS: 1.0000 | ORAL_TABLET | Freq: Once | ORAL | 12 refills | Status: AC

## 2018-03-09 MED ORDER — FERROUS FUMARATE-FOLIC ACID 324-1 MG PO TABS: 1.0000 | ORAL_TABLET | Freq: Once | ORAL | 12 refills | Status: AC

## 2018-03-09 NOTE — Discharge Instructions (Signed)
-  take iron pills once a day with citrus (orange juice, apple juice) -avoid dairy 30 min before and after taking iron pills -3 meals a day plus snacks!!!   Pregnancy and Anemia Anemia is a condition in which the concentration of red blood cells or hemoglobin in the blood is below normal. Hemoglobin is a substance in red blood cells that carries oxygen to the tissues of the body. Anemia results in not enough oxygen reaching these tissues. Anemia during pregnancy is common because the fetus uses more iron and folic acid as it is developing. Your body may not produce enough red blood cells because of this. Also, during pregnancy, the liquid part of the blood (plasma) increases by about 50%, and the red blood cells increase by only 25%. This lowers the concentration of the red blood cells and creates a natural anemia-like situation. What are the causes? The most common cause of anemia during pregnancy is not having enough iron in the body to make red blood cells (iron deficiency anemia). Other causes may include:  Folic acid deficiency.  Vitamin B12 deficiency.  Certain prescription or over-the-counter medicines.  Certain medical conditions or infections that destroy red blood cells.  A low platelet count and bleeding caused by antibodies that go through the placenta to the fetus from the mothers blood.  What are the signs or symptoms? Mild anemia may not be noticeable. If it becomes severe, symptoms may include:  Tiredness.  Shortness of breath, especially with exercise.  Weakness.  Fainting.  Pale looking skin.  Headaches.  Feeling a fast or irregular heartbeat (palpitations).  How is this diagnosed? The type of anemia is usually diagnosed from your family and medical history and blood tests. How is this treated? Treatment of anemia during pregnancy depends on the cause of the anemia. Treatment can include:  Supplements of iron, vitamin B12, or folic acid.  A blood  transfusion. This may be needed if blood loss is severe.  Hospitalization. This may be needed if there is significant continual blood loss.  Dietary changes.  Follow these instructions at home:  Follow your dietitian's or health care provider's dietary recommendations.  Increase your vitamin C intake. This will help the stomach absorb more iron.  Eat a diet rich in iron. This would include foods such as: ? Liver. ? Beef. ? Whole grain bread. ? Eggs. ? Dried fruit.  Take iron and vitamins as directed by your health care provider.  Eat green leafy vegetables. These are a good source of folic acid. Contact a health care provider if:  You have frequent or lasting headaches.  You are looking pale.  You are bruising easily. Get help right away if:  You have extreme weakness, shortness of breath, or chest pain.  You become dizzy or have trouble concentrating.  You have heavy vaginal bleeding.  You develop a rash.  You have bloody or black, tarry stools.  You faint.  You vomit up blood.  You vomit repeatedly.  You have abdominal pain.  You have a fever or persistent symptoms for more than 2-3 days.  You have a fever and your symptoms suddenly get worse.  You are dehydrated. This information is not intended to replace advice given to you by your health care provider. Make sure you discuss any questions you have with your health care provider. Document Released: 09/21/2000 Document Revised: 03/01/2016 Document Reviewed: 05/06/2013 Elsevier Interactive Patient Education  2017 [employer]

## 2018-03-09 NOTE — MAU Note (Signed)
Is so fatigued, has no energy, feels weak.  Still hasn't had her cycle.  Neg HPT, but she doesn't think it is right.

## 2018-03-09 NOTE — MAU Provider Note (Signed)
Patient Kara Myers is 27 y.o. [id number] At [redacted]w[redacted]d here with complaints of fatigue.  She denies bleeding, pain, discharge.   History     CSN: [id number]  Arrival date and time: 03/09/18 1315   First Provider Initiated Contact with Patient 03/09/18 1549      Chief Complaint  Patient presents with  . Fatigue  . Possible Pregnancy   Possible Pregnancy  Associated symptoms include fatigue. Pertinent negatives include no fever.  She took a pregnancy test 3 weeks ago in MAU and it was negative.   She had a burger at 1 pm; she didn't eat breakfast. Yesterday she had a salad and that was it. She says that she has been following a diet and exercising.   OB History    Gravida  5   Para  2   Term  2   Preterm      AB  2   Living  2     SAB      TAB  2   Ectopic      Multiple      Live Births  2           Past Medical History:  Diagnosis Date  . Herpes simplex    last outbreak in 2010  . Sickle cell trait (HCC)   . Urinary tract infection     Past Surgical History:  Procedure Laterality Date  . DILATION AND CURETTAGE OF UTERUS      Family History  Problem Relation Age of Onset  . Diabetes Mother   . Diabetes Father     Social History   Tobacco Use  . Smoking status: Current Every Day Smoker  . Smokeless tobacco: Never Used  . Tobacco comment: 1-2 cig/day  Substance Use Topics  . Alcohol use: Yes  . Drug use: Yes    Types: Marijuana    Comment: states does not smoke weed now    Allergies: No Known Allergies  Medications Prior to Admission  Medication Sig Dispense Refill Last Dose  . acetaminophen (TYLENOL) 325 MG tablet Take 650 mg by mouth every 6 (six) hours as needed for mild pain or headache.   09/17/2016 at Unknown time  . ondansetron (ZOFRAN ODT) 8 MG disintegrating tablet 8mg  ODT q4 hours prn nausea 4 tablet 0   . Prenatal Vit-Fe Fumarate-FA (PRENATAL MULTIVITAMIN) TABS tablet Take 1 tablet by mouth daily at 12 noon.   Not Taking at  Unknown time    Review of Systems  Constitutional: Positive for fatigue. Negative for fever.  Respiratory: Negative.   Cardiovascular: Negative.   Genitourinary: Negative.   Musculoskeletal: Negative.   Skin: Negative.   Neurological: Negative for dizziness and light-headedness.   Physical Exam   Blood pressure 134/80, pulse 73, temperature 98.7 F (37.1 C), temperature source Oral, resp. rate 16, weight 238 lb 8 oz (108.2 kg), last menstrual period 01/29/2018, SpO2 100 %, unknown if currently breastfeeding.  Physical Exam  Constitutional: She is oriented to person, place, and time. She appears well-developed.  HENT:  Head: Normocephalic.  Neck: Normal range of motion.  GI: Soft.  Musculoskeletal: Normal range of motion.  Neurological: She is alert and oriented to person, place, and time.  Skin: Skin is warm and dry.    MAU Course  Procedures  MDM -Per RN, patient is desirous of [place]. Without pain or bleeding, [place] not warranted.  -UPT positive -CBC not done  Assessment and Plan   1. Fatigue, unspecified type  2. Assured patient that fatigue most likely due to early pregnancy and her diet (not eating enough). Reviewed importance of healthy diet and snacks; increasing water intake to at least 100 oz per day (half of her body weight in oz).   3. US not performed today as patient has no ob-gyn complaints.   4. RX for prenatal vitamins and iron given; reviewed how to take.  5. Make appt to begin prenatal care.   Charlesetta GaribaldiKathryn Lorraine Kooistra 03/09/2018, 3:50 PM

## 2018-04-25 ENCOUNTER — Encounter (HOSPITAL_COMMUNITY): Payer: Self-pay | Admitting: *Deleted

## 2018-04-25 ENCOUNTER — Other Ambulatory Visit: Payer: Self-pay

## 2018-04-25 ENCOUNTER — Inpatient Hospital Stay (HOSPITAL_COMMUNITY)
Admission: AD | Admit: 2018-04-25 | Discharge: 2018-04-25 | Disposition: A | Discharge status: Home / Self Care | Payer: Self-pay | Source: Ambulatory Visit | Attending: Obstetrics & Gynecology | Admitting: Obstetrics & Gynecology

## 2018-04-25 DIAGNOSIS — Z833 Family history of diabetes mellitus: Secondary | ICD-10-CM | POA: Insufficient documentation

## 2018-04-25 DIAGNOSIS — R350 Frequency of micturition: Secondary | ICD-10-CM | POA: Insufficient documentation

## 2018-04-25 DIAGNOSIS — O219 Vomiting of pregnancy, unspecified: Secondary | ICD-10-CM | POA: Insufficient documentation

## 2018-04-25 DIAGNOSIS — O26891 Other specified pregnancy related conditions, first trimester: Secondary | ICD-10-CM | POA: Insufficient documentation

## 2018-04-25 DIAGNOSIS — D573 Sickle-cell trait: Secondary | ICD-10-CM | POA: Insufficient documentation

## 2018-04-25 DIAGNOSIS — Z3A12 12 weeks gestation of pregnancy: Secondary | ICD-10-CM | POA: Insufficient documentation

## 2018-04-25 DIAGNOSIS — O99331 Smoking (tobacco) complicating pregnancy, first trimester: Secondary | ICD-10-CM | POA: Insufficient documentation

## 2018-04-25 DIAGNOSIS — F17210 Nicotine dependence, cigarettes, uncomplicated: Secondary | ICD-10-CM | POA: Insufficient documentation

## 2018-04-25 DIAGNOSIS — O26811 Pregnancy related exhaustion and fatigue, first trimester: Secondary | ICD-10-CM

## 2018-04-25 DIAGNOSIS — O99011 Anemia complicating pregnancy, first trimester: Secondary | ICD-10-CM | POA: Insufficient documentation

## 2018-04-25 LAB — BASIC METABOLIC PANEL
Anion gap: 9 (ref 5–15)
BUN: 8 mg/dL (ref 6–20)
CALCIUM: 9.5 mg/dL (ref 8.9–10.3)
CO2: 23 mmol/L (ref 22–32)
Chloride: 104 mmol/L (ref 98–111)
Creatinine, Ser: 0.65 mg/dL (ref 0.44–1.00)
Glucose, Bld: 79 mg/dL (ref 70–99)
Potassium: 4.2 mmol/L (ref 3.5–5.1)
Sodium: 136 mmol/L (ref 135–145)

## 2018-04-25 LAB — URINALYSIS, ROUTINE W REFLEX MICROSCOPIC
BILIRUBIN URINE: NEGATIVE
Glucose, UA: NEGATIVE mg/dL
HGB URINE DIPSTICK: NEGATIVE
Ketones, ur: NEGATIVE mg/dL
Leukocytes, UA: NEGATIVE
NITRITE: NEGATIVE
PH: 6 (ref 5.0–8.0)
Protein, ur: NEGATIVE mg/dL
SPECIFIC GRAVITY, URINE: 1.018 (ref 1.005–1.030)

## 2018-04-25 LAB — CBC
HEMATOCRIT: 34.6 % — AB (ref 36.0–46.0)
Hemoglobin: 11.8 g/dL — ABNORMAL LOW (ref 12.0–15.0)
MCH: 29.6 pg (ref 26.0–34.0)
MCHC: 34.1 g/dL (ref 30.0–36.0)
MCV: 86.9 fL (ref 78.0–100.0)
PLATELETS: 276 10*3/uL (ref 150–400)
RBC: 3.98 MIL/uL (ref 3.87–5.11)
RDW: 12.7 % (ref 11.5–15.5)
WBC: 8.9 10*3/uL (ref 4.0–10.5)

## 2018-04-25 LAB — WET PREP, GENITAL
Sperm: NONE SEEN
TRICH WET PREP: NONE SEEN
Yeast Wet Prep HPF POC: NONE SEEN

## 2018-04-25 MED ORDER — VITAMIN B-6 25 MG PO TABS: 25.0000 mg | ORAL_TABLET | Freq: Three times a day (TID) | ORAL | 0 refills | Status: DC

## 2018-04-25 MED ORDER — PROMETHAZINE HCL 25 MG PO TABS: 25.0000 mg | ORAL_TABLET | Freq: Four times a day (QID) | ORAL | 0 refills | Status: DC | PRN

## 2018-04-25 NOTE — MAU Note (Signed)
(  carrying drink, chips and sub box). Last night was very sick, threw up everything she had eaten.  This morning what she threw up - it was yellow, then changed to red.  Had not drank or ate anything red.  When she urinated, there was blood on the tissue and when she wiped.

## 2018-04-25 NOTE — MAU Provider Note (Signed)
History  CSN: [id number] Arrival date and time: 04/25/18 1245  First Provider Initiated Contact with Patient 04/25/18 1626      Chief Complaint  Patient presents with  . Emesis  . Hematuria  . Hemoptysis    HPI: Kara Myers is a 27 y.o. [phone] with IUP at [redacted]w[redacted]d who presents to maternity admissions reporting nausea, vomiting, and hematuria. She reports feeling nauseous and fatigued for some time, and yesterday threw up twice, then again this morning. Reports that this morning emesis looked yellowish, but then kinda red and looked like blood, small amount. Since then has been able to keep down a sub sandwich. Has been keeping fluids down. Also reports that today she saw some blood in her urine when she urinated and was sure where it came from. No blood in underwear and no blood when she voided here. Denies dysuria, but endorses urinary frequency. Denies fever, chills, dizziness or lightheadedness. Endorses mild abdominal pain, on LLQ.  She has not yet established prenatal care, reports she is waiting for Medicaid.   OB History  Gravida Para Term Preterm AB Living  5 2 2   2 2   SAB TAB Ectopic Multiple Live Births    2     2    # Outcome Date GA Lbr Len/2nd Weight Sex Delivery Anes PTL Lv  5 Current           4 Term 01/23/14 [redacted]w[redacted]d 03:15 / 00:08 7 lb 9.9 oz (3.456 kg) M Vag-Spont EPI  LIV  3 TAB 11/2011          2 Term 10/05/06    F Vag-Spont EPI  LIV  1 TAB            Past Medical History:  Diagnosis Date  . Herpes simplex    last outbreak in 2010  . Sickle cell trait (HCC)   . Urinary tract infection    Past Surgical History:  Procedure Laterality Date  . DILATION AND CURETTAGE OF UTERUS     Family History  Problem Relation Age of Onset  . Diabetes Mother   . Diabetes Father    Social History   Socioeconomic History  . Marital status: Single    Spouse name: Not on file  . Number of children: Not on file  . Years of education: Not on file  . Highest education  level: Not on file  Occupational History  . Not on file  Social Needs  . Financial resource strain: Not on file  . Food insecurity:    Worry: Not on file    Inability: Not on file  . Transportation needs:    Medical: Not on file    Non-medical: Not on file  Tobacco Use  . Smoking status: Current Every Day Smoker  . Smokeless tobacco: Never Used  . Tobacco comment: 1-2 cig/day  Substance and Sexual Activity  . Alcohol use: Yes  . Drug use: Yes    Types: Marijuana    Comment: states does not smoke weed now  . Sexual activity: Yes    Birth control/protection: None  Lifestyle  . Physical activity:    Days per week: Not on file    Minutes per session: Not on file  . Stress: Not on file  Relationships  . Social connections:    Talks on phone: Not on file    Gets together: Not on file    Attends religious service: Not on file    Active member of  club or organization: Not on file    Attends meetings of clubs or organizations: Not on file    Relationship status: Not on file  . Intimate partner violence:    Fear of current or ex partner: Not on file    Emotionally abused: Not on file    Physically abused: Not on file    Forced sexual activity: Not on file  Other Topics Concern  . Not on file  Social History Narrative  . Not on file   No Known Allergies  Medications Prior to Admission  Medication Sig Dispense Refill Last Dose  . acetaminophen (TYLENOL) 325 MG tablet Take 650 mg by mouth every 6 (six) hours as needed for mild pain or headache.   09/17/2016 at Unknown time  . ondansetron (ZOFRAN ODT) 8 MG disintegrating tablet 8mg  ODT q4 hours prn nausea 4 tablet 0   . Prenatal Vit-Fe Fumarate-FA (PRENATAL MULTIVITAMIN) TABS tablet Take 1 tablet by mouth daily at 12 noon.   Not Taking at Unknown time    I have reviewed patient's Past Medical Hx, Surgical Hx, Family Hx, Social Hx, medications and allergies.   Review of Systems: Negative except for what is mentioned in  HPI.  Physical Exam   Blood pressure 130/71, pulse 87, temperature 98.9 F (37.2 C), temperature source Oral, resp. rate 18, weight 239 lb (108.4 kg), last menstrual period 01/29/2018, SpO2 99 %, unknown if currently breastfeeding.  Constitutional: Well-developed, well-nourished female in no acute distress.  HENT: [place]/AT, normal oropharynx mucosa. MMM Eyes: normal conjunctivae, no scleral icterus Cardiovascular: normal rate, regular rhythm Respiratory: normal effort, lungs CTAB.  GI: Abd soft, non-tender, gravid appropriate for gestational age.   GU: Neg CVAT. Pelvic: NEFG. Normal vaginal mucosa without lesions. Cervix pink, visually closed, without lesion, small amount of vaginal discharge, no blood noted. Cervix closed. No tenderness with bimanual exam MSK: Extremities nontender, no edema Neurologic: Alert and oriented x 4. Psych: Normal mood and affect Skin: warm and dry  FHT: 152 on doppler. Bedside U/S shows IUP with FHT in 150s  MAU Course/MDM:   Nursing notes and VS reviewed. Patient seen and examined, as noted above.  Wet prep, GC chlamydia collected UA/urine culture ordered CBC, BMP ordered  Results reviewed:  Results for orders placed or performed during the hospital encounter of 04/25/18  Wet prep, genital  Result Value Ref Range   Yeast Wet Prep HPF POC NONE SEEN NONE SEEN   Trich, Wet Prep NONE SEEN NONE SEEN   Clue Cells Wet Prep HPF POC PRESENT (A) NONE SEEN   WBC, Wet Prep HPF POC MANY (A) NONE SEEN   Sperm NONE SEEN   Urinalysis, Routine w reflex microscopic  Result Value Ref Range   Color, Urine YELLOW YELLOW   APPearance CLEAR CLEAR   Specific Gravity, Urine 1.018 1.005 - 1.030   pH 6.0 5.0 - 8.0   Glucose, UA NEGATIVE NEGATIVE mg/dL   Hgb urine dipstick NEGATIVE NEGATIVE   Bilirubin Urine NEGATIVE NEGATIVE   Ketones, ur NEGATIVE NEGATIVE mg/dL   Protein, ur NEGATIVE NEGATIVE mg/dL   Nitrite NEGATIVE NEGATIVE   Leukocytes, UA NEGATIVE NEGATIVE   CBC  Result Value Ref Range   WBC 8.9 4.0 - 10.5 K/uL   RBC 3.98 3.87 - 5.11 MIL/uL   Hemoglobin 11.8 (L) 12.0 - 15.0 g/dL   HCT 96.234.6 (L) 95.236.0 - 84.146.0 %   MCV 86.9 78.0 - 100.0 fL   MCH 29.6 26.0 - 34.0 pg  MCHC 34.1 30.0 - 36.0 g/dL   RDW 16.1 09.6 - 04.5 %   Platelets 276 150 - 400 K/uL  Basic metabolic panel  Result Value Ref Range   Sodium 136 135 - 145 mmol/L   Potassium 4.2 3.5 - 5.1 mmol/L   Chloride 104 98 - 111 mmol/L   CO2 23 22 - 32 mmol/L   Glucose, Bld 79 70 - 99 mg/dL   BUN 8 6 - 20 mg/dL   Creatinine, Ser 4.09 0.44 - 1.00 mg/dL   Calcium 9.5 8.9 - 81.1 mg/dL   GFR calc non Af Amer >60 >60 mL/min   GFR calc Af Amer >60 >60 mL/min   Anion gap 9 5 - 15   Labs wnl.  Pt denies current nausea and able to tolerate po here.    Assessment and Plan  Assessment: 1. Nausea/vomiting in pregnancy   2. Fatigue during pregnancy in first trimester     Plan: --Discharge home in stable condition.  --Start Vitami B 6 25 mg TID --Phenergan prn --Start PNVs --List of OB providers given   Frederik Pear, MD 04/25/2018 6:35 PM

## 2018-04-25 NOTE — Discharge Instructions (Signed)
Morning Sickness Morning sickness is when you feel sick to your stomach (nauseous) during pregnancy. This nauseous feeling may or may not come with vomiting. It often occurs in the morning but can be a problem any time of day. Morning sickness is most common during the first trimester, but it may continue throughout pregnancy. While morning sickness is unpleasant, it is usually harmless unless you develop severe and continual vomiting (hyperemesis gravidarum). This condition requires more intense treatment. What are the causes? The cause of morning sickness is not completely known but seems to be related to normal hormonal changes that occur in pregnancy. What increases the risk? You are at greater risk if you:  Experienced nausea or vomiting before your pregnancy.  Had morning sickness during a previous pregnancy.  Are pregnant with more than one baby, such as twins.  How is this treated? Do not use any medicines (prescription, over-the-counter, or herbal) for morning sickness without first talking to your health care provider. Your health care provider may prescribe or recommend:  Vitamin B6 supplements.  Anti-nausea medicines.  The herbal medicine ginger.  Follow these instructions at home:  Only take over-the-counter or prescription medicines as directed by your health care provider.  Taking multivitamins before getting pregnant can prevent or decrease the severity of morning sickness in most women.  Eat a piece of dry toast or unsalted crackers before getting out of bed in the morning.  Eat five or six small meals a day.  Eat dry and bland foods (rice, baked potato). Foods high in carbohydrates are often helpful.  Do not drink liquids with your meals. Drink liquids between meals.  Avoid greasy, fatty, and spicy foods.  Get someone to cook for you if the smell of any food causes nausea and vomiting.  If you feel nauseous after taking prenatal vitamins, take the vitamins at  night or with a snack.  Snack on protein foods (nuts, yogurt, cheese) between meals if you are hungry.  Eat unsweetened gelatins for desserts.  Wearing an acupressure wristband (worn for sea sickness) may be helpful.  Acupuncture may be helpful.  Do not smoke.  Get a humidifier to keep the air in your house free of odors.  Get plenty of fresh air. Contact a health care provider if:  Your home remedies are not working, and you need medicine.  You feel dizzy or lightheaded.  You are losing weight. Get help right away if:  You have persistent and uncontrolled nausea and vomiting.  You pass out (faint). This information is not intended to replace advice given to you by your health care provider. Make sure you discuss any questions you have with your health care provider. Document Released: 11/15/2006 Document Revised: 03/01/2016 Document Reviewed: 03/11/2013 Elsevier Interactive Patient Education  2017 Elsevier Inc.  [place] [facility] Providers    [facility]       Phone: [phone]  [facility] [place] Phone: [phone]  [facility] [place]  Phone: [phone]  [facility]  Phone: [phone]  [facility] [place]  Phone: [phone]  [facility]       Phone: [phone]  [facility]    Phone: [phone]   [facility]    Phone: [phone]  [facility]    Phone: [phone]  [facility]    Phone: [phone]  [facility]  Phone: [phone]  [facility]       Phone: [phone]   [facility]  Phone: [phone]  [facility]    Phone: [phone]  [facility]  [place]   Phone: [phone]  [facility]      Phone: [phone]  [facility]    Phone: [phone]

## 2018-04-26 LAB — CULTURE, OB URINE: Culture: NO GROWTH

## 2018-04-28 LAB — GC/CHLAMYDIA PROBE AMP (~~LOC~~) NOT AT ARMC
CHLAMYDIA, DNA PROBE: NEGATIVE
NEISSERIA GONORRHEA: NEGATIVE

## 2018-07-02 ENCOUNTER — Encounter: Payer: Self-pay | Admitting: Family Medicine

## 2018-07-02 ENCOUNTER — Ambulatory Visit (INDEPENDENT_AMBULATORY_CARE_PROVIDER_SITE_OTHER): Payer: Self-pay | Admitting: Family Medicine

## 2018-07-02 ENCOUNTER — Other Ambulatory Visit (HOSPITAL_COMMUNITY)
Admission: RE | Admit: 2018-07-02 | Discharge: 2018-07-02 | Disposition: A | Discharge status: Home / Self Care | Payer: Commercial Managed Care - PPO | Source: Ambulatory Visit | Attending: Family Medicine | Admitting: Family Medicine

## 2018-07-02 VITALS — BP 103/54 | HR 85 | Wt 243.1 lb

## 2018-07-02 DIAGNOSIS — Z8759 Personal history of other complications of pregnancy, childbirth and the puerperium: Secondary | ICD-10-CM | POA: Insufficient documentation

## 2018-07-02 DIAGNOSIS — O99210 Obesity complicating pregnancy, unspecified trimester: Secondary | ICD-10-CM | POA: Insufficient documentation

## 2018-07-02 DIAGNOSIS — O0990 Supervision of high risk pregnancy, unspecified, unspecified trimester: Secondary | ICD-10-CM | POA: Insufficient documentation

## 2018-07-02 DIAGNOSIS — Z3A22 22 weeks gestation of pregnancy: Secondary | ICD-10-CM | POA: Diagnosis not present

## 2018-07-02 DIAGNOSIS — O0992 Supervision of high risk pregnancy, unspecified, second trimester: Secondary | ICD-10-CM

## 2018-07-02 MED ORDER — ASPIRIN EC 81 MG PO TBEC: 81.0000 mg | DELAYED_RELEASE_TABLET | Freq: Every day | ORAL | 3 refills | Status: AC

## 2018-07-02 MED ORDER — PRENATAL VITAMIN 27-0.8 MG PO TABS: 1.0000 | ORAL_TABLET | Freq: Every day | ORAL | 3 refills | Status: AC

## 2018-07-02 NOTE — Progress Notes (Signed)
[facility] 07/03/18 1100

## 2018-07-02 NOTE — Progress Notes (Signed)
Subjective:   Kara Myers is a 27 y.o. [id number] at [redacted]w[redacted]d by LMP being seen today for her first obstetrical visit.  Her obstetrical history is significant for history of gestational hypertension in second pregnancy. Patient does intend to breast feed. Pregnancy history fully reviewed.  1st: 09/2006 2nd: 01/2014, gHTN, not induced, fetal brady with pitocin but got better    - two MAU visits this pregnancy for fatigue, N/V - much better, only occasional vomiting   Patient reports no complaints. Was hoping to have anatomy U/S today to find out sex, slightly disappointed not able to.   HISTORY: OB History  Gravida Para Term Preterm AB Living  5 2 2  0 2 2  SAB TAB Ectopic Multiple Live Births  0 2 0 0 2    # Outcome Date GA Lbr Len/2nd Weight Sex Delivery Anes PTL Lv  5 Current           4 Term 01/23/14 [redacted]w[redacted]d 03:15 / 00:08 7 lb 9.9 oz (3.456 kg) M Vag-Spont EPI  LIV     Name: Netter,BOY Mykelle     Apgar1: 8  Apgar5: 9  3 TAB 11/2011          2 Term 10/05/06    F Vag-Spont EPI  LIV  1 TAB            Last pap smear was  2019 and was normal Past Medical History:  Diagnosis Date  . Herpes simplex    last outbreak in 2010  . Pregnancy induced hypertension   . Sickle cell trait (HCC)   . Urinary tract infection    Past Surgical History:  Procedure Laterality Date  . DILATION AND CURETTAGE OF UTERUS     Family History  Problem Relation Age of Onset  . Diabetes Mother   . Diabetes Father    Social History   Tobacco Use  . Smoking status: Former Smoker    Last attempt to quit: 03/08/2018    Years since quitting: 0.3  . Smokeless tobacco: Never Used  . Tobacco comment: 1-2 cig/day  Substance Use Topics  . Alcohol use: Not Currently  . Drug use: Not Currently    Types: Marijuana    Comment: states does not smoke weed now   No Known Allergies Current Outpatient Medications on File Prior to Visit  Medication Sig Dispense Refill  . Prenatal Vit-Fe Fumarate-FA  (PRENATAL MULTIVITAMIN) TABS tablet Take 1 tablet by mouth daily at 12 noon.     No current facility-administered medications on file prior to visit.      Exam   Vitals:   07/02/18 0907  BP: (!) 103/54  Pulse: 85  Weight: 243 lb 1.6 oz (110.3 kg)   Fetal Heart Rate (bpm): 152   Uterus:    fundal height appropriate 21  Pelvic Exam: Perineum: no hemorrhoids, normal perineum   Vulva: normal external genitalia, no lesions   Vagina:  normal mucosa, normal discharge   Cervix: no lesions and normal, pap smear not done (had at [facility])   Adnexa: normal adnexa and no mass, fullness, tenderness   Bony Pelvis: average  System: General: well-developed, well-nourished female in no acute distress   Breast:  normal appearance, no masses or tenderness   Skin: normal coloration and turgor, no rashes   Neurologic: oriented, normal, negative, normal mood   Extremities: normal strength, tone, and muscle mass, ROM of all joints is normal   HEENT extraocular movement intact and sclera clear,  anicteric   Mouth/Teeth mucous membranes moist, pharynx normal without lesions and dental hygiene good   Neck supple and no masses   Cardiovascular: regular rate and rhythm   Respiratory:  no respiratory distress, normal breath sounds   Abdomen: soft, non-tender; bowel sounds normal; no masses,  no organomegaly     Assessment:   Pregnancy: [id number] Patient Active Problem List   Diagnosis Date Noted  . History of gestational hypertension 07/02/2018  . Supervision of high risk pregnancy, antepartum 07/02/2018  . Obesity in pregnancy 07/02/2018     Plan:  27yo [id number] at [redacted]w[redacted]d by LMP who presents to establish prenatal care.    Supervision of Pregnancy  Initial labs drawn including urine culture  Continue prenatal vitamins. New Rx sent. Need records request for Pap smear at follow-up visit  Counseled on flu shot, will wait until follow-up visit to decide  Genetic Screening discussed, patient declines    Ultrasound discussed; fetal anatomic survey: requested - will have at HD tomorrow because no insurance at this time  Problem list reviewed and updated.  History of Gestational Hypertension  Counseled on signs and symptoms of preeclampsia Recommended starting daily baby ASA  Baseline UPC, HELLP labs collected   The nature of [place] [facility] with multiple MDs and other Advanced Practice Providers was explained to patient; also emphasized that residents, students are part of our team.Routine obstetric precautions reviewed.  Return in about 4 weeks (around 07/30/2018).   Cristal Deer. Earlene Plater, DO OB/GYN Fellow

## 2018-07-02 NOTE — Patient Instructions (Signed)
Second Trimester of Pregnancy The second trimester is from week 13 through week 28, month 4 through 6. This is often the time in pregnancy that you feel your best. Often times, morning sickness has lessened or quit. You may have more energy, and you may get hungry more often. Your unborn baby (fetus) is growing rapidly. At the end of the sixth month, he or she is about 9 inches long and weighs about 1 pounds. You will likely feel the baby move (quickening) between 18 and 20 weeks of pregnancy. Follow these instructions at home:  Avoid all smoking, herbs, and alcohol. Avoid drugs not approved by your doctor.  Do not use any tobacco products, including cigarettes, chewing tobacco, and electronic cigarettes. If you need help quitting, ask your doctor. You may get counseling or other support to help you quit.  Only take medicine as told by your doctor. Some medicines are safe and some are not during pregnancy.  Exercise only as told by your doctor. Stop exercising if you start having cramps.  Eat regular, healthy meals.  Wear a good support bra if your breasts are tender.  Do not use hot tubs, steam rooms, or saunas.  Wear your seat belt when driving.  Avoid raw meat, uncooked cheese, and liter boxes and soil used by cats.  Take your prenatal vitamins.  Take 1500-2000 milligrams of calcium daily starting at the 20th week of pregnancy until you deliver your baby.  Try taking medicine that helps you poop (stool softener) as needed, and if your doctor approves. Eat more fiber by eating fresh fruit, vegetables, and whole grains. Drink enough fluids to keep your pee (urine) clear or pale yellow.  Take warm water baths (sitz baths) to soothe pain or discomfort caused by hemorrhoids. Use hemorrhoid cream if your doctor approves.  If you have puffy, bulging veins (varicose veins), wear support hose. Raise (elevate) your feet for 15 minutes, 3-4 times a day. Limit salt in your diet.  Avoid heavy  lifting, wear low heals, and sit up straight.  Rest with your legs raised if you have leg cramps or low back pain.  Visit your dentist if you have not gone during your pregnancy. Use a soft toothbrush to brush your teeth. Be gentle when you floss.  You can have sex (intercourse) unless your doctor tells you not to.  Go to your doctor visits. Get help if:  You feel dizzy.  You have mild cramps or pressure in your lower belly (abdomen).  You have a nagging pain in your belly area.  You continue to feel sick to your stomach (nauseous), throw up (vomit), or have watery poop (diarrhea).  You have bad smelling fluid coming from your vagina.  You have pain with peeing (urination). Get help right away if:  You have a fever.  You are leaking fluid from your vagina.  You have spotting or bleeding from your vagina.  You have severe belly cramping or pain.  You lose or gain weight rapidly.  You have trouble catching your breath and have chest pain.  You notice sudden or extreme puffiness (swelling) of your face, hands, ankles, feet, or legs.  You have not felt the baby move in over an hour.  You have severe headaches that do not go away with medicine.  You have vision changes. This information is not intended to replace advice given to you by your health care provider. Make sure you discuss any questions you have with your health care   provider. Document Released: 12/19/2009 Document Revised: 03/01/2016 Document Reviewed: 11/25/2012 Elsevier Interactive Patient Education  2017 Elsevier Inc.  

## 2018-07-03 LAB — PROTEIN / CREATININE RATIO, URINE
Creatinine, Urine: 133.3 mg/dL
PROTEIN UR: 11.2 mg/dL
Protein/Creat Ratio: 84 mg/g creat (ref 0–200)

## 2018-07-03 LAB — GC/CHLAMYDIA PROBE AMP (~~LOC~~) NOT AT ARMC
CHLAMYDIA, DNA PROBE: NEGATIVE
Neisseria Gonorrhea: NEGATIVE

## 2018-07-04 LAB — OBSTETRIC PANEL, INCLUDING HIV
ANTIBODY SCREEN: NEGATIVE
BASOS ABS: 0 10*3/uL (ref 0.0–0.2)
BASOS: 0 %
EOS (ABSOLUTE): 0.1 10*3/uL (ref 0.0–0.4)
Eos: 1 %
HEMATOCRIT: 34.9 % (ref 34.0–46.6)
HIV SCREEN 4TH GENERATION: NONREACTIVE
Hemoglobin: 11.2 g/dL (ref 11.1–15.9)
Hepatitis B Surface Ag: NEGATIVE
IMMATURE GRANS (ABS): 0.1 10*3/uL (ref 0.0–0.1)
Immature Granulocytes: 1 %
LYMPHS: 22 %
Lymphocytes Absolute: 1.9 10*3/uL (ref 0.7–3.1)
MCH: 28.4 pg (ref 26.6–33.0)
MCHC: 32.1 g/dL (ref 31.5–35.7)
MCV: 89 fL (ref 79–97)
MONOCYTES: 4 %
MONOS ABS: 0.3 10*3/uL (ref 0.1–0.9)
NEUTROS PCT: 72 %
Neutrophils Absolute: 6.2 10*3/uL (ref 1.4–7.0)
PLATELETS: 298 10*3/uL (ref 150–450)
RBC: 3.94 x10E6/uL (ref 3.77–5.28)
RDW: 11.8 % — AB (ref 12.3–15.4)
RPR Ser Ql: NONREACTIVE
RUBELLA: 1.56 {index} (ref >0.99)
Rh Factor: POSITIVE
WBC: 8.6 10*3/uL (ref 3.4–10.8)

## 2018-07-04 LAB — COMPREHENSIVE METABOLIC PANEL
ALT: 13 IU/L (ref 0–32)
AST: 13 IU/L (ref 0–40)
Albumin/Globulin Ratio: 1.2 (ref 1.2–2.2)
Albumin: 3.6 g/dL (ref 3.5–5.5)
Alkaline Phosphatase: 52 IU/L (ref 39–117)
BUN/Creatinine Ratio: 11 (ref 9–23)
BUN: 6 mg/dL (ref 6–20)
CHLORIDE: 101 mmol/L (ref 96–106)
CO2: 20 mmol/L (ref 20–29)
Calcium: 9.3 mg/dL (ref 8.7–10.2)
Creatinine, Ser: 0.55 mg/dL — ABNORMAL LOW (ref 0.57–1.00)
GFR calc Af Amer: 150 mL/min/{1.73_m2} (ref >59)
GFR calc non Af Amer: 130 mL/min/{1.73_m2} (ref >59)
GLUCOSE: 78 mg/dL (ref 65–99)
Globulin, Total: 3 g/dL (ref 1.5–4.5)
Potassium: 4.2 mmol/L (ref 3.5–5.2)
Sodium: 135 mmol/L (ref 134–144)
Total Protein: 6.6 g/dL (ref 6.0–8.5)

## 2018-07-04 LAB — HEMOGLOBINOPATHY EVALUATION
Ferritin: 29 ng/mL (ref 15–150)
HGB A: 61.3 % — AB (ref 96.4–98.8)
HGB VARIANT: 0 %
Hgb A2 Quant: 3.6 % — ABNORMAL HIGH (ref 1.8–3.2)
Hgb C: 0 %
Hgb F Quant: 0.5 % (ref 0.0–2.0)
Hgb S: 34.6 % — ABNORMAL HIGH
Hgb Solubility: POSITIVE — AB

## 2018-07-04 LAB — URINE CULTURE, OB REFLEX: ORGANISM ID, BACTERIA: NO GROWTH

## 2018-07-04 LAB — CULTURE, OB URINE

## 2018-07-14 ENCOUNTER — Encounter: Payer: Self-pay | Admitting: Family Medicine

## 2018-07-14 DIAGNOSIS — D573 Sickle-cell trait: Secondary | ICD-10-CM | POA: Insufficient documentation

## 2018-07-17 ENCOUNTER — Encounter: Payer: Self-pay | Admitting: *Deleted

## 2018-07-31 IMAGING — US US OB COMP LESS 14 WK
1 series · 15 of 28 positions shown · non-contrast
Comparison: Pelvic ultrasound performed 12/01/2013

CLINICAL DATA: Acute onset of vaginal bleeding.  Initial encounter.

EXAM:
OBSTETRIC <14 WK US AND TRANSVAGINAL OB US
TECHNIQUE: Both transabdominal and transvaginal ultrasound examinations were
performed for complete evaluation of the gestation as well as the
maternal uterus, adnexal regions, and pelvic cul-de-sac.
Transvaginal technique was performed to assess early pregnancy.

[Series 1: us ob comp less 14 wk · 15 of 49 slices shown]
[im 1/49]
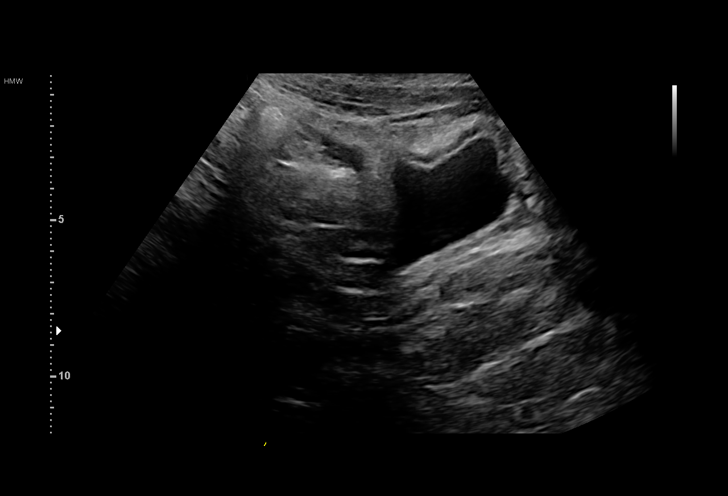
[im 4/49]
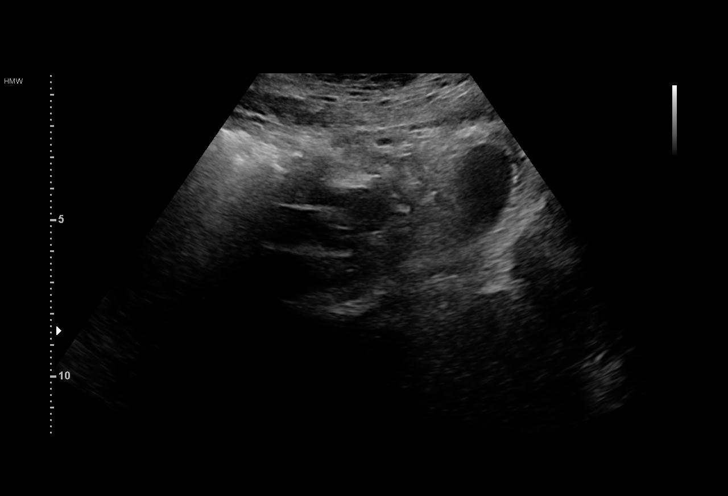
[im 8/49]
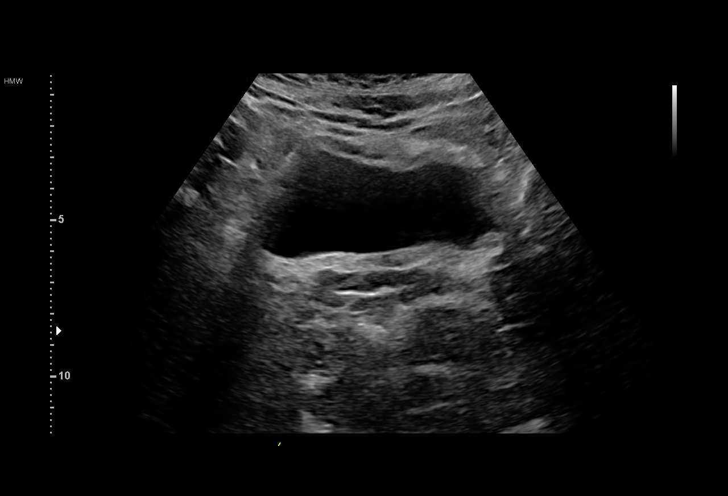
[im 11/49]
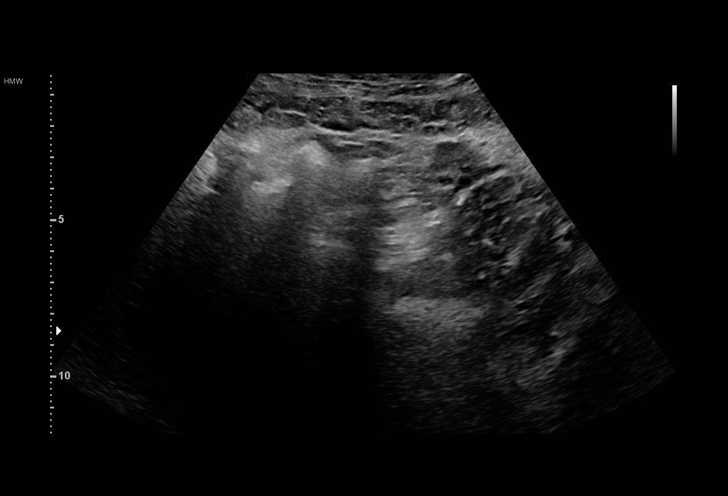
[im 15/49]
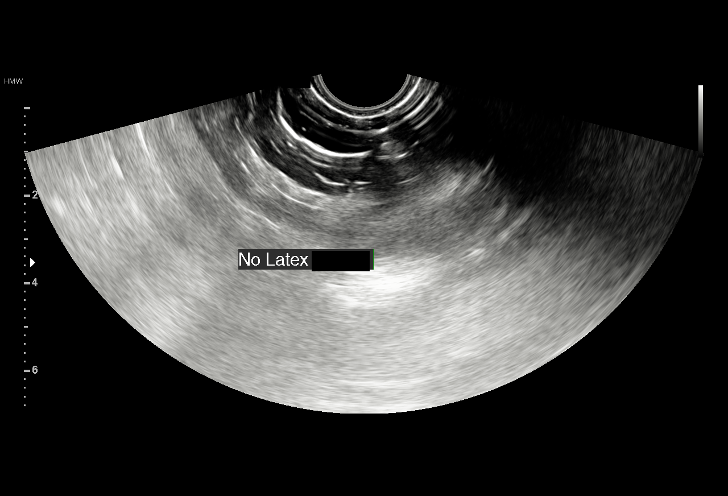
[im 18/49]
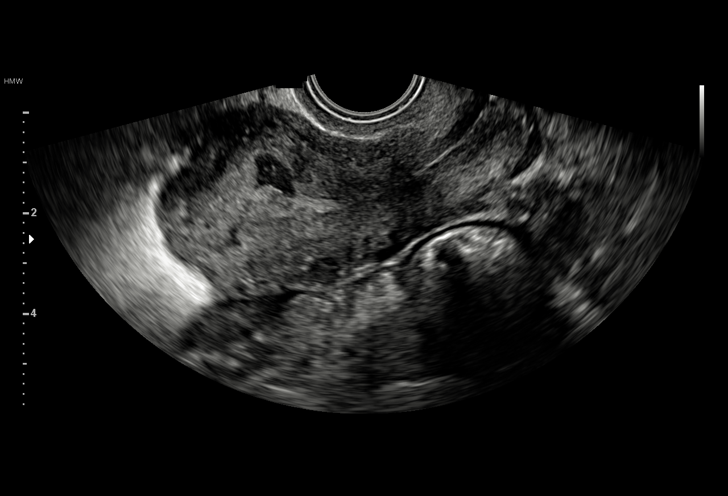
[im 22/49]
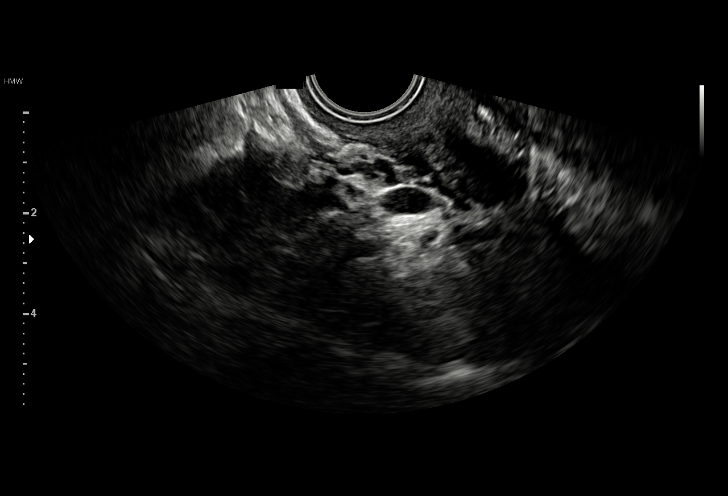
[im 25/49]
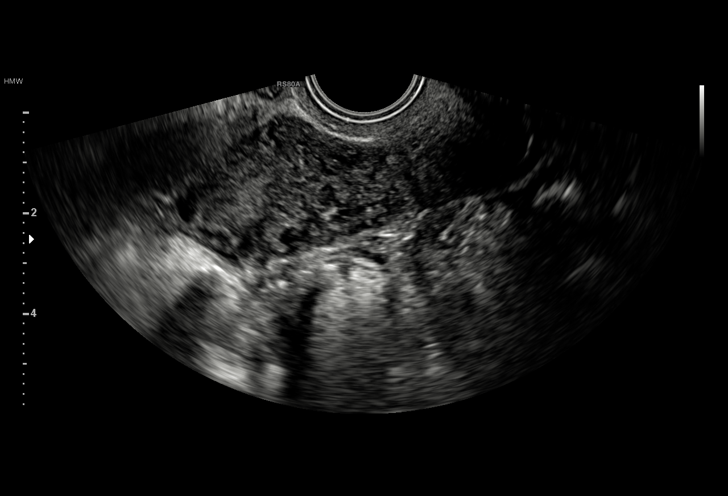
[im 27/49]
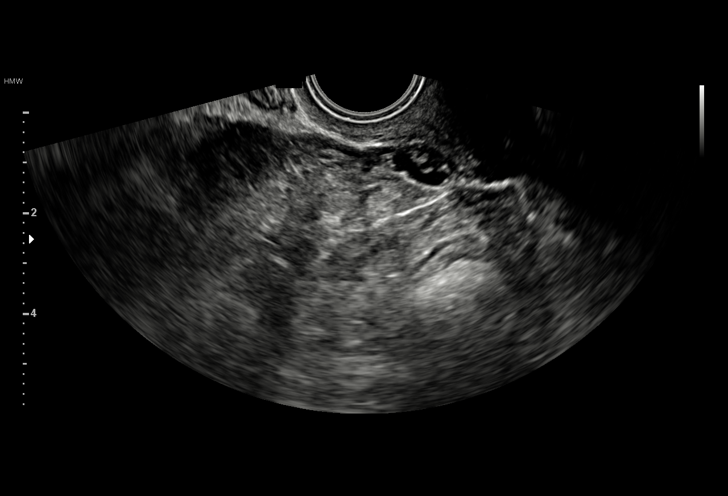
[im 31/49]
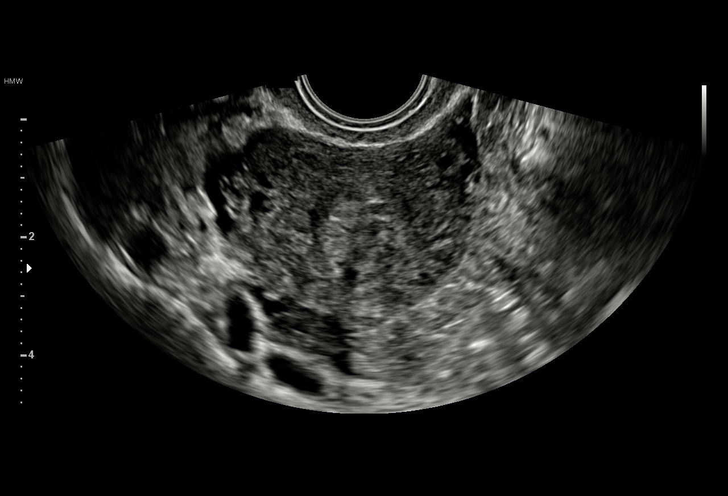
[im 34/49]
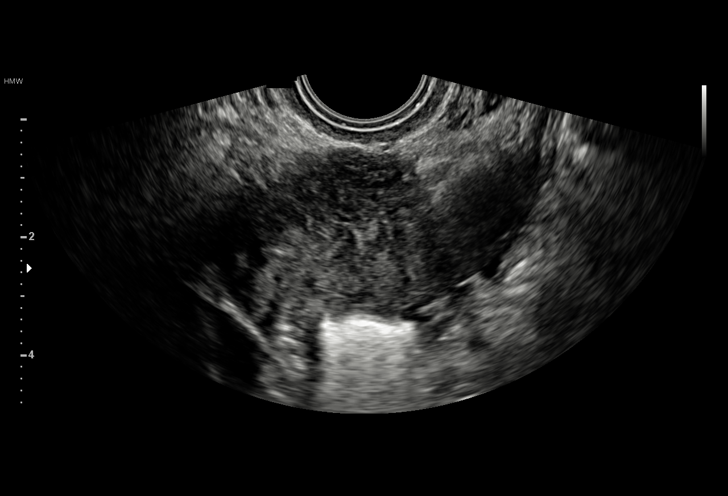
[im 38/49]
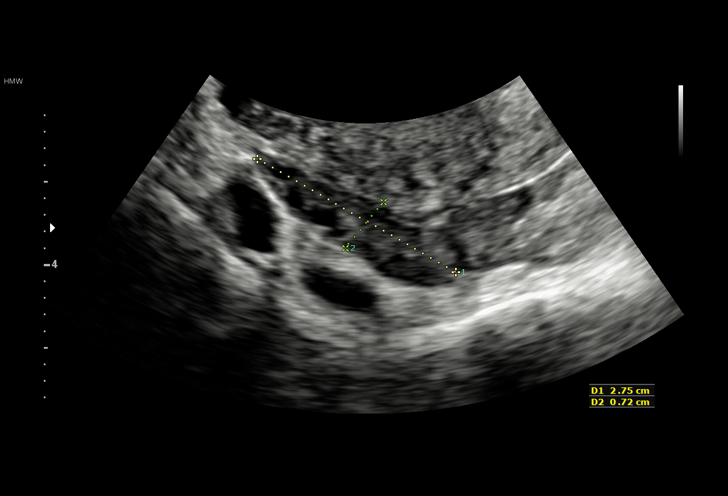
[im 41/49]
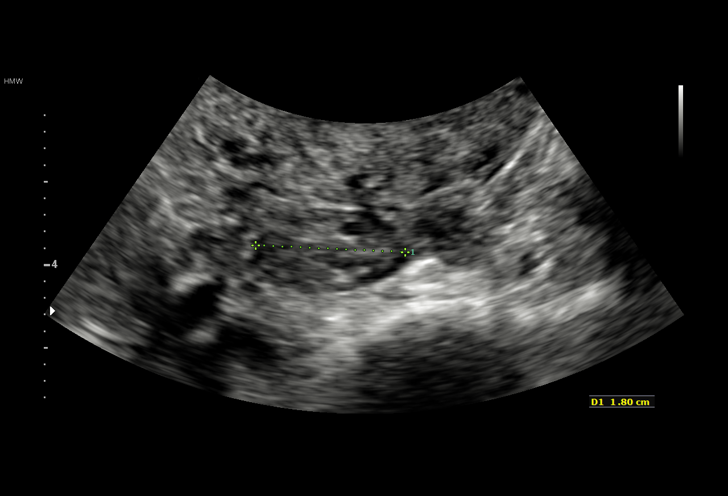
[im 45/49]
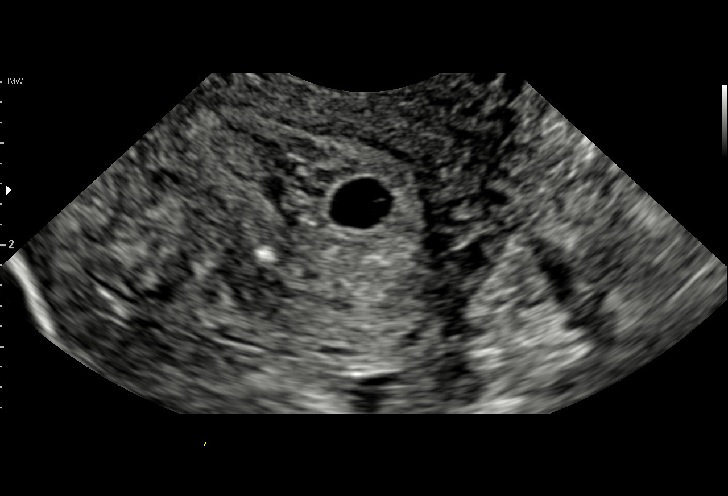
[im 49/49]
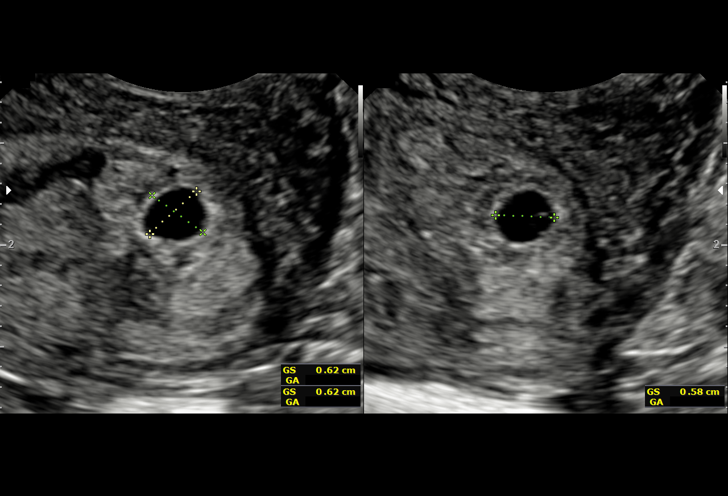

[15 of 28 positions shown; findings below may reference images not displayed]

FINDINGS: Intrauterine gestational sac: Single; visualized and normal in
shape.

Yolk sac:  Yes

Embryo:  No

Cardiac Activity: N/A

MSD:  6 mm   5 w   2 d

Subchorionic hemorrhage: A moderate amount of subchorionic
hemorrhage is noted.

Maternal uterus/adnexae: The uterus is otherwise unremarkable in
appearance.

The right ovary is unremarkable in appearance, measuring
approximately 2.8 x 0.7 x 1.8 cm. The left ovary is not visualized
on this study. No suspicious adnexal masses are seen.

No free fluid is seen within the pelvic cul-de-sac.
IMPRESSION: 1. Single intrauterine gestational sac, measuring 6 mm in mean sac
diameter, corresponding to a gestational age of 5 weeks 2 days. A
yolk sac is seen. No embryo is yet visualized. This does not match
the gestational age by LMP, but it remains too early to determine a
new estimated date of delivery. If the patient's quantitative beta
HCG level continues to trend upward, would perform follow-up pelvic
ultrasound in 2 weeks for further evaluation.
2. Moderate amount of subchorionic hemorrhage noted.

## 2018-08-01 ENCOUNTER — Encounter: Payer: Self-pay | Admitting: Obstetrics & Gynecology

## 2018-08-07 ENCOUNTER — Ambulatory Visit (INDEPENDENT_AMBULATORY_CARE_PROVIDER_SITE_OTHER): Payer: Commercial Managed Care - PPO | Admitting: Obstetrics & Gynecology

## 2018-08-07 ENCOUNTER — Other Ambulatory Visit (HOSPITAL_COMMUNITY)
Admission: RE | Admit: 2018-08-07 | Discharge: 2018-08-07 | Disposition: A | Discharge status: Home / Self Care | Payer: Commercial Managed Care - PPO | Source: Ambulatory Visit | Attending: Obstetrics & Gynecology | Admitting: Obstetrics & Gynecology

## 2018-08-07 ENCOUNTER — Encounter: Payer: Self-pay | Admitting: Obstetrics & Gynecology

## 2018-08-07 VITALS — BP 133/87 | HR 101 | Wt 247.2 lb

## 2018-08-07 DIAGNOSIS — O0990 Supervision of high risk pregnancy, unspecified, unspecified trimester: Secondary | ICD-10-CM

## 2018-08-07 DIAGNOSIS — O0992 Supervision of high risk pregnancy, unspecified, second trimester: Secondary | ICD-10-CM

## 2018-08-07 DIAGNOSIS — Z3A27 27 weeks gestation of pregnancy: Secondary | ICD-10-CM | POA: Diagnosis not present

## 2018-08-07 DIAGNOSIS — Z23 Encounter for immunization: Secondary | ICD-10-CM | POA: Diagnosis not present

## 2018-08-07 DIAGNOSIS — O99212 Obesity complicating pregnancy, second trimester: Secondary | ICD-10-CM

## 2018-08-07 DIAGNOSIS — O99210 Obesity complicating pregnancy, unspecified trimester: Secondary | ICD-10-CM

## 2018-08-07 DIAGNOSIS — Z8759 Personal history of other complications of pregnancy, childbirth and the puerperium: Secondary | ICD-10-CM

## 2018-08-07 NOTE — Progress Notes (Signed)
   PRENATAL VISIT NOTE  Subjective:  Kara Myers is a 27 y.o. O1H0865 at [redacted]w[redacted]d being seen today for ongoing prenatal care.  She is currently monitored for the following issues for this low-risk pregnancy and has History of gestational hypertension; Supervision of high risk pregnancy, antepartum; Obesity in pregnancy; and Sickle cell trait (HCC) on their problem list.  Patient reports vaginal discharge, pelvic pain for last 10 days.  Contractions: Not present. Vag. Bleeding: None.  Movement: Present. Denies leaking of fluid.   The following portions of the patient's history were reviewed and updated as appropriate: allergies, current medications, past family history, past medical history, past social history, past surgical history and problem list. Problem list updated.  Objective:   Vitals:   08/07/18 1117  BP: 133/87  Pulse: (!) 101  Weight: 247 lb 3.2 oz (112.1 kg)    Fetal Status: Fetal Heart Rate (bpm): 145   Movement: Present     General:  Alert, oriented and cooperative. Patient is in no acute distress.  Skin: Skin is warm and dry. No rash noted.   Cardiovascular: Normal heart rate noted  Respiratory: Normal respiratory effort, no problems with respiration noted  Abdomen: Soft, gravid, appropriate for gestational age.  Pain/Pressure: Present     Pelvic: Cervical exam performed        Extremities: Normal range of motion.  Edema: Trace  Mental Status: Normal mood and affect. Normal behavior. Normal judgment and thought content.   Assessment and Plan:  Pregnancy: [id number] at [redacted]w[redacted]d  1. History of gestational hypertension   2. Supervision of high risk pregnancy in second trimester - will get 2 hour GTT and labs in a week along with a repeat BP  3. Obesity in pregnancy   Preterm labor symptoms and general obstetric precautions including but not limited to vaginal bleeding, contractions, leaking of fluid and fetal movement were reviewed in detail with the  patient. Please refer to After Visit Summary for other counseling recommendations.  Return in about 1 week (around 08/14/2018) for 2 hour GTT and labs, needs pap smear records from health dept.  No future appointments.  Allie Bossier, MD

## 2018-08-08 ENCOUNTER — Telehealth: Payer: Self-pay | Admitting: Family Medicine

## 2018-08-08 LAB — CERVICOVAGINAL ANCILLARY ONLY
Bacterial vaginitis: NEGATIVE
CANDIDA VAGINITIS: NEGATIVE
TRICH (WINDOWPATH): NEGATIVE

## 2018-08-08 NOTE — Telephone Encounter (Signed)
lvm for patient about FMLA papers being completed and ready for pick-up.

## 2018-08-11 DIAGNOSIS — Z029 Encounter for administrative examinations, unspecified: Secondary | ICD-10-CM

## 2018-08-13 ENCOUNTER — Other Ambulatory Visit: Payer: Medicaid Other

## 2018-08-14 ENCOUNTER — Other Ambulatory Visit: Payer: Medicaid Other

## 2018-08-14 DIAGNOSIS — O0992 Supervision of high risk pregnancy, unspecified, second trimester: Secondary | ICD-10-CM

## 2018-08-14 DIAGNOSIS — O0990 Supervision of high risk pregnancy, unspecified, unspecified trimester: Secondary | ICD-10-CM

## 2018-08-15 LAB — CBC
HEMATOCRIT: 33.3 % — AB (ref 34.0–46.6)
Hemoglobin: 10.9 g/dL — ABNORMAL LOW (ref 11.1–15.9)
MCH: 28.4 pg (ref 26.6–33.0)
MCHC: 32.7 g/dL (ref 31.5–35.7)
MCV: 87 fL (ref 79–97)
PLATELETS: 267 10*3/uL (ref 150–450)
RBC: 3.84 x10E6/uL (ref 3.77–5.28)
RDW: 11.8 % — AB (ref 12.3–15.4)
WBC: 7.6 10*3/uL (ref 3.4–10.8)

## 2018-08-15 LAB — HIV ANTIBODY (ROUTINE TESTING W REFLEX): HIV SCREEN 4TH GENERATION: NONREACTIVE

## 2018-08-15 LAB — GLUCOSE TOLERANCE, 2 HOURS W/ 1HR
GLUCOSE, 1 HOUR: 95 mg/dL (ref 65–179)
Glucose, 2 hour: 97 mg/dL (ref 65–152)
Glucose, Fasting: 69 mg/dL (ref 65–91)

## 2018-08-15 LAB — RPR: RPR Ser Ql: NONREACTIVE

## 2018-08-28 ENCOUNTER — Ambulatory Visit (INDEPENDENT_AMBULATORY_CARE_PROVIDER_SITE_OTHER): Payer: Commercial Managed Care - PPO | Admitting: Obstetrics & Gynecology

## 2018-08-28 VITALS — BP 122/62 | HR 87 | Wt 241.0 lb

## 2018-08-28 DIAGNOSIS — Z8759 Personal history of other complications of pregnancy, childbirth and the puerperium: Secondary | ICD-10-CM

## 2018-08-28 DIAGNOSIS — O0993 Supervision of high risk pregnancy, unspecified, third trimester: Secondary | ICD-10-CM

## 2018-08-28 DIAGNOSIS — O0990 Supervision of high risk pregnancy, unspecified, unspecified trimester: Secondary | ICD-10-CM

## 2018-08-28 DIAGNOSIS — Z3A30 30 weeks gestation of pregnancy: Secondary | ICD-10-CM

## 2018-08-28 NOTE — Patient Instructions (Signed)
Return to clinic for any scheduled appointments or obstetric concerns, or go to [facility]U for evaluation  

## 2018-08-28 NOTE — Progress Notes (Signed)
   PRENATAL VISIT NOTE  Subjective:  Kara Myers is a 27 y.o. [id number] at 7562w1d being seen today for ongoing prenatal care.  She is currently monitored for the following issues for this high-risk pregnancy and has History of gestational hypertension; Supervision of high risk pregnancy, antepartum; Obesity in pregnancy; and Sickle cell trait (HCC) on their problem list.  Patient reports no complaints.  Contractions: Not present. Vag. Bleeding: None.  Movement: Present. Denies leaking of fluid.   The following portions of the patient's history were reviewed and updated as appropriate: allergies, current medications, past family history, past medical history, past social history, past surgical history and problem list. Problem list updated.  Objective:   Vitals:   08/28/18 1141  BP: 122/62  Pulse: 87  Weight: 241 lb (109.3 kg)    Fetal Status: Fetal Heart Rate (bpm): 146 Fundal Height: 31 cm Movement: Present     General:  Alert, oriented and cooperative. Patient is in no acute distress.  Skin: Skin is warm and dry. No rash noted.   Cardiovascular: Normal heart rate noted  Respiratory: Normal respiratory effort, no problems with respiration noted  Abdomen: Soft, gravid, appropriate for gestational age.  Pain/Pressure: Present     Pelvic: Cervical exam deferred        Extremities: Normal range of motion.  Edema: Trace  Mental Status: Normal mood and affect. Normal behavior. Normal judgment and thought content.   Assessment and Plan:  Pregnancy: U4Q0347G5P2022 at 7862w1d  1. History of gestational hypertension Stable BP. Continue ASA.  2. Supervision of high risk pregnancy, antepartum Considering BTL, will sign Medicaid papers. Preterm labor symptoms and general obstetric precautions including but not limited to vaginal bleeding, contractions, leaking of fluid and fetal movement were reviewed in detail with the patient. Please refer to After Visit Summary for other counseling  recommendations.  Return in about 2 weeks (around 09/11/2018) for OB Visit (HOB).  No future appointments.  Jaynie CollinsUgonna Angelicia Lessner, MD

## 2018-09-11 ENCOUNTER — Encounter: Payer: Commercial Managed Care - PPO | Admitting: Family Medicine

## 2018-09-15 ENCOUNTER — Encounter: Payer: Commercial Managed Care - PPO | Admitting: Family Medicine

## 2018-09-15 ENCOUNTER — Ambulatory Visit (INDEPENDENT_AMBULATORY_CARE_PROVIDER_SITE_OTHER): Payer: Commercial Managed Care - PPO | Admitting: Family Medicine

## 2018-09-15 VITALS — BP 103/56 | HR 64 | Wt 251.0 lb

## 2018-09-15 DIAGNOSIS — O09293 Supervision of pregnancy with other poor reproductive or obstetric history, third trimester: Secondary | ICD-10-CM

## 2018-09-15 DIAGNOSIS — Z8759 Personal history of other complications of pregnancy, childbirth and the puerperium: Secondary | ICD-10-CM

## 2018-09-15 DIAGNOSIS — O0990 Supervision of high risk pregnancy, unspecified, unspecified trimester: Secondary | ICD-10-CM

## 2018-09-15 DIAGNOSIS — Z3A32 32 weeks gestation of pregnancy: Secondary | ICD-10-CM

## 2018-09-15 NOTE — Patient Instructions (Signed)

## 2018-09-16 NOTE — Progress Notes (Signed)
   PRENATAL VISIT NOTE  Subjective:  Kara Myers is a 27 y.o. [id number] at 645w6d being seen today for ongoing prenatal care.  She is currently monitored for the following issues for this high-risk pregnancy and has History of gestational hypertension; Supervision of high risk pregnancy, antepartum; Obesity in pregnancy; and Sickle cell trait (HCC) on their problem list.  Patient reports no complaints.  Contractions: Not present. Vag. Bleeding: None.  Movement: Present. Denies leaking of fluid.   The following portions of the patient's history were reviewed and updated as appropriate: allergies, current medications, past family history, past medical history, past social history, past surgical history and problem list. Problem list updated.  Objective:   Vitals:   09/15/18 1531  BP: (!) 103/56  Pulse: 64  Weight: 251 lb (113.9 kg)    Fetal Status: Fetal Heart Rate (bpm): 140 Fundal Height: 32 cm Movement: Present     General:  Alert, oriented and cooperative. Patient is in no acute distress.  Skin: Skin is warm and dry. No rash noted.   Cardiovascular: Normal heart rate noted  Respiratory: Normal respiratory effort, no problems with respiration noted  Abdomen: Soft, gravid, appropriate for gestational age.  Pain/Pressure: Present     Pelvic: Cervical exam deferred        Extremities: Normal range of motion.  Edema: None  Mental Status: Normal mood and affect. Normal behavior. Normal judgment and thought content.   Assessment and Plan:  Pregnancy: [id number] at 425w6d  1. Supervision of high risk pregnancy, antepartum Continue prenatal care.   2. History of gestational hypertension Continue ASA BP is good today  Preterm labor symptoms and general obstetric precautions including but not limited to vaginal bleeding, contractions, leaking of fluid and fetal movement were reviewed in detail with the patient. Please refer to After Visit Summary for other counseling recommendations.   Return in 2 weeks (on 09/29/2018).  Future Appointments  Date Time Provider Department Center  10/07/2018 11:15 AM Allie Bossierove, Myra C, MD [facility]    Reva Boresanya S , MD

## 2018-10-07 ENCOUNTER — Encounter: Payer: Commercial Managed Care - PPO | Admitting: Obstetrics & Gynecology

## 2018-10-08 NOTE — L&D Delivery Note (Signed)
Patient: Kara Myers MRN: [id number]  GBS status: positive, IAP given: PCN  Patient is a 28 y.o. now G5P3 s/p NSVD at [redacted]w[redacted]d, who was admitted for IOL for postdates. SROM 5h 34m prior to delivery with clear fluid.    Delivery Note At 2:27 AM a viable female was delivered via Vaginal, Spontaneous (Presentation: cephalic; OA).  APGAR: 9, 9; weight: pending (appears AGA).   Placenta status: intact, 3-vessel.  Cord:  with the following complications: none.   Head delivered OA. No nuchal cord present. Shoulder and body delivered in usual fashion. Infant with spontaneous cry, placed on mother's abdomen, dried and bulb suctioned. Cord clamped x 2 after 1-minute delay, and cut by family member. Cord blood drawn. Placenta delivered spontaneously with gentle cord traction. Fundus firm with massage and Pitocin. Perineum inspected and found to have no lacerations, with good hemostasis achieved.  Anesthesia: Epidural   Episiotomy: None Lacerations: None Suture Repair: none Est. Blood Loss (mL): 54   Mom to postpartum.  Baby to Couplet care / Skin to Skin.  Gwenevere Abbot 11/13/2018, 2:53 AM

## 2018-10-24 ENCOUNTER — Telehealth: Payer: Self-pay | Admitting: Advanced Practice Midwife

## 2018-10-24 NOTE — Telephone Encounter (Signed)
The patient was on the wait list. Called to offer the available appointment. The patient agreed.

## 2018-10-27 ENCOUNTER — Encounter: Payer: Commercial Managed Care - PPO | Admitting: Family Medicine

## 2018-10-28 ENCOUNTER — Encounter: Payer: Commercial Managed Care - PPO | Admitting: Obstetrics & Gynecology

## 2018-10-30 ENCOUNTER — Ambulatory Visit (INDEPENDENT_AMBULATORY_CARE_PROVIDER_SITE_OTHER): Payer: Commercial Managed Care - PPO | Admitting: Obstetrics and Gynecology

## 2018-10-30 ENCOUNTER — Encounter: Payer: Self-pay | Admitting: Obstetrics and Gynecology

## 2018-10-30 VITALS — BP 128/82 | HR 76 | Wt 250.6 lb

## 2018-10-30 DIAGNOSIS — D573 Sickle-cell trait: Secondary | ICD-10-CM

## 2018-10-30 DIAGNOSIS — Z8759 Personal history of other complications of pregnancy, childbirth and the puerperium: Secondary | ICD-10-CM

## 2018-10-30 DIAGNOSIS — Z6841 Body Mass Index (BMI) 40.0 and over, adult: Secondary | ICD-10-CM

## 2018-10-30 DIAGNOSIS — O0993 Supervision of high risk pregnancy, unspecified, third trimester: Secondary | ICD-10-CM

## 2018-10-30 DIAGNOSIS — O99213 Obesity complicating pregnancy, third trimester: Secondary | ICD-10-CM

## 2018-10-30 DIAGNOSIS — O0990 Supervision of high risk pregnancy, unspecified, unspecified trimester: Secondary | ICD-10-CM

## 2018-10-30 DIAGNOSIS — O99210 Obesity complicating pregnancy, unspecified trimester: Secondary | ICD-10-CM

## 2018-10-30 LAB — POCT URINALYSIS DIP (DEVICE)
Bilirubin Urine: NEGATIVE
GLUCOSE, UA: NEGATIVE mg/dL
Hgb urine dipstick: NEGATIVE
Ketones, ur: NEGATIVE mg/dL
LEUKOCYTES UA: NEGATIVE
Nitrite: NEGATIVE
Protein, ur: NEGATIVE mg/dL
Specific Gravity, Urine: 1.015 (ref 1.005–1.030)
UROBILINOGEN UA: 1 mg/dL (ref 0.0–1.0)
pH: 7 (ref 5.0–8.0)

## 2018-10-30 NOTE — Progress Notes (Signed)
Prenatal Visit Note Date: 10/30/2018 Clinic: [facility]  Subjective:  Kara Myers is a 28 y.o. [id number] at [redacted]w[redacted]d being seen today for ongoing prenatal care.  She is currently monitored for the following issues for this low-risk pregnancy and has History of gestational hypertension; Supervision of high risk pregnancy, antepartum; Obesity in pregnancy; Sickle cell trait (HCC); and BMI 40.0-44.9, adult (HCC) on their problem list.  Patient reports no complaints.   Contractions: Not present. Vag. Bleeding: None.  Movement: Present. Denies leaking of fluid.   The following portions of the patient's history were reviewed and updated as appropriate: allergies, current medications, past family history, past medical history, past social history, past surgical history and problem list. Problem list updated.  Objective:   Vitals:   10/30/18 1608 10/30/18 1614  BP: (!) 137/96 128/82  Pulse: 74 76  Weight: 250 lb 9.6 oz (113.7 kg)     Fetal Status: Fetal Heart Rate (bpm): 139 Fundal Height: 39 cm Movement: Present  Presentation: Vertex  General:  Alert, oriented and cooperative. Patient is in no acute distress.  Skin: Skin is warm and dry. No rash noted.   Cardiovascular: Normal heart rate noted  Respiratory: Normal respiratory effort, no problems with respiration noted  Abdomen: Soft, gravid, appropriate for gestational age. Pain/Pressure: Present     Pelvic:  Cervical exam deferred Dilation: Fingertip Effacement (%): Thick Station: Ballotable  Extremities: Normal range of motion.  Edema: Trace  Mental Status: Normal mood and affect. Normal behavior. Normal judgment and thought content.   Urinalysis:      Assessment and Plan:  Pregnancy: [id number] at [redacted]w[redacted]d  1. Supervision of high risk pregnancy, antepartum Set up for IOL nv  2. BMI 40.0-44.9, adult (HCC)  3. History of gestational hypertension Will see back for earlier rob  4. Obesity in pregnancy  5.  Sickle cell trait (HCC)  Term labor symptoms and general obstetric precautions including but not limited to vaginal bleeding, contractions, leaking of fluid and fetal movement were reviewed in detail with the patient. Please refer to After Visit Summary for other counseling recommendations.  Return in about 4 days (around 11/03/2018) for low risk ob.   [place] Bing, MD

## 2018-10-30 NOTE — Progress Notes (Signed)
Elevated PHQ-9.  Pt declines integrated behavioral health.

## 2018-11-03 ENCOUNTER — Ambulatory Visit (INDEPENDENT_AMBULATORY_CARE_PROVIDER_SITE_OTHER): Payer: Commercial Managed Care - PPO | Admitting: Advanced Practice Midwife

## 2018-11-03 ENCOUNTER — Encounter: Payer: Self-pay | Admitting: Advanced Practice Midwife

## 2018-11-03 ENCOUNTER — Encounter: Payer: Commercial Managed Care - PPO | Admitting: Advanced Practice Midwife

## 2018-11-03 ENCOUNTER — Other Ambulatory Visit (HOSPITAL_COMMUNITY)
Admission: RE | Admit: 2018-11-03 | Discharge: 2018-11-03 | Disposition: A | Discharge status: Home / Self Care | Payer: Commercial Managed Care - PPO | Source: Ambulatory Visit | Attending: Advanced Practice Midwife | Admitting: Advanced Practice Midwife

## 2018-11-03 ENCOUNTER — Encounter: Payer: Commercial Managed Care - PPO | Admitting: Obstetrics and Gynecology

## 2018-11-03 VITALS — BP 136/77 | HR 88 | Wt 253.0 lb

## 2018-11-03 DIAGNOSIS — O0993 Supervision of high risk pregnancy, unspecified, third trimester: Secondary | ICD-10-CM

## 2018-11-03 DIAGNOSIS — O0990 Supervision of high risk pregnancy, unspecified, unspecified trimester: Secondary | ICD-10-CM | POA: Diagnosis not present

## 2018-11-03 LAB — OB RESULTS CONSOLE GC/CHLAMYDIA: Gonorrhea: NEGATIVE

## 2018-11-03 LAB — OB RESULTS CONSOLE GBS: GBS: POSITIVE

## 2018-11-03 NOTE — Progress Notes (Signed)
   PRENATAL VISIT NOTE  Subjective:  Kara Myers is a 28 y.o. [id number] at [redacted]w[redacted]d being seen today for ongoing prenatal care.  She is currently monitored for the following issues for this low-risk pregnancy and has History of gestational hypertension; Supervision of high risk pregnancy, antepartum; Obesity in pregnancy; Sickle cell trait (HCC); and BMI 40.0-44.9, adult (HCC) on their problem list.  Patient reports no complaints.  Contractions: Not present. Vag. Bleeding: None.  Movement: Present. Denies leaking of fluid.   The following portions of the patient's history were reviewed and updated as appropriate: allergies, current medications, past family history, past medical history, past social history, past surgical history and problem list. Problem list updated.  Objective:   Vitals:   11/03/18 1358  BP: 136/77  Pulse: 88  Weight: 253 lb (114.8 kg)    Fetal Status: Fetal Heart Rate (bpm): 141 Fundal Height: 39 cm Movement: Present  Presentation: Vertex  General:  Alert, oriented and cooperative. Patient is in no acute distress.  Skin: Skin is warm and dry. No rash noted.   Cardiovascular: Normal heart rate noted  Respiratory: Normal respiratory effort, no problems with respiration noted  Abdomen: Soft, gravid, appropriate for gestational age.  Pain/Pressure: Present     Pelvic: Cervical exam performed Dilation: Fingertip Effacement (%): Thick Station: -3  Extremities: Normal range of motion.  Edema: Trace  Mental Status: Normal mood and affect. Normal behavior. Normal judgment and thought content.   Assessment and Plan:  Pregnancy: [id number] at [redacted]w[redacted]d  1. Supervision of high risk pregnancy, antepartum - Routine care - Post-dates BPP/NST at next visit - IOL scheduled for 41 weeks - Culture, beta strep (group b only) - Cervicovaginal ancillary only( [place])  Term labor symptoms and general obstetric precautions including but not limited to vaginal bleeding,  contractions, leaking of fluid and fetal movement were reviewed in detail with the patient. Please refer to After Visit Summary for other counseling recommendations.  Return in about 1 week (around 11/10/2018).  Future Appointments  Date Time Provider Department Center  11/12/2018  7:30 AM [facility] RO[facility] None    Thressa Sheller DNP, CNM  11/03/18  2:14 PM

## 2018-11-03 NOTE — Patient Instructions (Signed)
Vaginal delivery means that you give birth by pushing your baby out of your birth canal (vagina). A team of health care providers will help you before, during, and after vaginal delivery. Birth experiences are unique for every woman and every pregnancy, and birth experiences vary depending on where you choose to give birth. What happens when I arrive at the birth center or hospital? Once you are in labor and have been admitted into the hospital or birth center, your health care provider may:  Review your pregnancy history and any concerns that you have.  Insert an IV into one of your veins. This may be used to give you fluids and medicines.  Check your blood pressure, pulse, temperature, and heart rate (vital signs).  Check whether your bag of water (amniotic sac) has broken (ruptured).  Talk with you about your birth plan and discuss pain control options. Monitoring Your health care provider may monitor your contractions (uterine monitoring) and your baby's heart rate (fetal monitoring). You may need to be monitored:  Often, but not continuously (intermittently).  All the time or for long periods at a time (continuously). Continuous monitoring may be needed if: ? You are taking certain medicines, such as medicine to relieve pain or make your contractions stronger. ? You have pregnancy or labor complications. Monitoring may be done by:  Placing a special stethoscope or a handheld monitoring device on your abdomen to check your baby's heartbeat and to check for contractions.  Placing monitors on your abdomen (external monitors) to record your baby's heartbeat and the frequency and length of contractions.  Placing monitors inside your uterus through your vagina (internal monitors) to record your baby's heartbeat and the frequency, length, and strength of your contractions. Depending on the type of monitor, it may remain in your uterus or on your baby's head until birth.  Telemetry. This is  a type of continuous monitoring that can be done with external or internal monitors. Instead of having to stay in bed, you are able to move around during telemetry. Physical exam Your health care provider may perform frequent physical exams. This may include:  Checking how and where your baby is positioned in your uterus.  Checking your cervix to determine: ? Whether it is thinning out (effacing). ? Whether it is opening up (dilating). What happens during labor and delivery?  Normal labor and delivery is divided into the following three stages: Stage 1  This is the longest stage of labor.  This stage can last for hours or days.  Throughout this stage, you will feel contractions. Contractions generally feel mild, infrequent, and irregular at first. They get stronger, more frequent (about every 2-3 minutes), and more regular as you move through this stage.  This stage ends when your cervix is completely dilated to 4 inches (10 cm) and completely effaced. Stage 2  This stage starts once your cervix is completely effaced and dilated and lasts until the delivery of your baby.  This stage may last from 20 minutes to 2 hours.  This is the stage where you will feel an urge to push your baby out of your vagina.  You may feel stretching and burning pain, especially when the widest part of your baby's head passes through the vaginal opening (crowning).  Once your baby is delivered, the umbilical cord will be clamped and cut. This usually occurs after waiting a period of 1-2 minutes after delivery.  Your baby will be placed on your bare chest (skin-to-skin contact) in   an upright position and covered with a warm blanket. Watch your baby for feeding cues, like rooting or sucking, and help the baby to your breast for his or her first feeding. Stage 3  This stage starts immediately after the birth of your baby and ends after you deliver the placenta.  This stage may take anywhere from 5 to 30  minutes.  After your baby has been delivered, you will feel contractions as your body expels the placenta and your uterus contracts to control bleeding. What can I expect after labor and delivery?  After labor is over, you and your baby will be monitored closely until you are ready to go home to ensure that you are both healthy. Your health care team will teach you how to care for yourself and your baby.  You and your baby will stay in the same room (rooming in) during your hospital stay. This will encourage early bonding and successful breastfeeding.  You may continue to receive fluids and medicines through an IV.  Your uterus will be checked and massaged regularly (fundal massage).  You will have some soreness and pain in your abdomen, vagina, and the area of skin between your vaginal opening and your anus (perineum).  If an incision was made near your vagina (episiotomy) or if you had some vaginal tearing during delivery, cold compresses may be placed on your episiotomy or your tear. This helps to reduce pain and swelling.  You may be given a squirt bottle to use instead of wiping when you go to the bathroom. To use the squirt bottle, follow these steps: ? Before you urinate, fill the squirt bottle with warm water. Do not use hot water. ? After you urinate, while you are sitting on the toilet, use the squirt bottle to rinse the area around your urethra and vaginal opening. This rinses away any urine and blood. ? Fill the squirt bottle with clean water every time you use the bathroom.  It is normal to have vaginal bleeding after delivery. Wear a sanitary pad for vaginal bleeding and discharge. Summary  Vaginal delivery means that you will give birth by pushing your baby out of your birth canal (vagina).  Your health care provider may monitor your contractions (uterine monitoring) and your baby's heart rate (fetal monitoring).  Your health care provider may perform a physical  exam.  Normal labor and delivery is divided into three stages.  After labor is over, you and your baby will be monitored closely until you are ready to go home. This information is not intended to replace advice given to you by your health care provider. Make sure you discuss any questions you have with your health care provider. Document Released: 07/03/2008 Document Revised: 10/29/2017 Document Reviewed: 10/29/2017 Elsevier Interactive Patient Education  2019 [employer]  

## 2018-11-04 LAB — CERVICOVAGINAL ANCILLARY ONLY
Chlamydia: NEGATIVE
Neisseria Gonorrhea: NEGATIVE

## 2018-11-05 ENCOUNTER — Telehealth (HOSPITAL_COMMUNITY): Payer: Self-pay | Admitting: *Deleted

## 2018-11-05 ENCOUNTER — Encounter (HOSPITAL_COMMUNITY): Payer: Self-pay | Admitting: *Deleted

## 2018-11-05 NOTE — Telephone Encounter (Signed)
Preadmission screen  

## 2018-11-06 LAB — CULTURE, BETA STREP (GROUP B ONLY): STREP GP B CULTURE: POSITIVE — AB

## 2018-11-07 NOTE — Addendum Note (Signed)
Addended by: Thressa Sheller D on: 11/07/2018 09:59 AM   Modules accepted: Orders, SmartSet

## 2018-11-10 ENCOUNTER — Ambulatory Visit (INDEPENDENT_AMBULATORY_CARE_PROVIDER_SITE_OTHER): Payer: Commercial Managed Care - PPO | Admitting: *Deleted

## 2018-11-10 ENCOUNTER — Ambulatory Visit: Payer: Self-pay

## 2018-11-10 ENCOUNTER — Ambulatory Visit (INDEPENDENT_AMBULATORY_CARE_PROVIDER_SITE_OTHER): Payer: Commercial Managed Care - PPO | Admitting: Obstetrics & Gynecology

## 2018-11-10 VITALS — BP 122/75 | HR 64 | Wt 258.3 lb

## 2018-11-10 DIAGNOSIS — O0993 Supervision of high risk pregnancy, unspecified, third trimester: Secondary | ICD-10-CM

## 2018-11-10 DIAGNOSIS — O480 Post-term pregnancy: Secondary | ICD-10-CM

## 2018-11-10 DIAGNOSIS — O99213 Obesity complicating pregnancy, third trimester: Secondary | ICD-10-CM

## 2018-11-10 DIAGNOSIS — D573 Sickle-cell trait: Secondary | ICD-10-CM

## 2018-11-10 DIAGNOSIS — O99210 Obesity complicating pregnancy, unspecified trimester: Secondary | ICD-10-CM

## 2018-11-10 DIAGNOSIS — O0990 Supervision of high risk pregnancy, unspecified, unspecified trimester: Secondary | ICD-10-CM

## 2018-11-10 NOTE — Progress Notes (Signed)
   PRENATAL VISIT NOTE  Subjective:  Kara Myers is a 28 y.o. [id number] at [redacted]w[redacted]d being seen today for ongoing prenatal care.  She is currently monitored for the following issues for this high-risk pregnancy and has History of gestational hypertension; Supervision of high risk pregnancy, antepartum; Obesity in pregnancy; Sickle cell trait (HCC); and BMI 40.0-44.9, adult (HCC) on their problem list.  Patient reports no complaints.  Contractions: Irregular. Vag. Bleeding: None.  Movement: Present. Denies leaking of fluid.   The following portions of the patient's history were reviewed and updated as appropriate: allergies, current medications, past family history, past medical history, past social history, past surgical history and problem list. Problem list updated.  Objective:   Vitals:   11/10/18 1335  BP: 122/75  Pulse: 64  Weight: 258 lb 4.8 oz (117.2 kg)    Fetal Status: Fetal Heart Rate (bpm): NST   Movement: Present     General:  Alert, oriented and cooperative. Patient is in no acute distress.  Skin: Skin is warm and dry. No rash noted.   Cardiovascular: Normal heart rate noted  Respiratory: Normal respiratory effort, no problems with respiration noted  Abdomen: Soft, gravid, appropriate for gestational age.  Pain/Pressure: Present     Pelvic: Cervical exam deferred        Extremities: Normal range of motion.     Mental Status: Normal mood and affect. Normal behavior. Normal judgment and thought content.   Assessment and Plan:  Pregnancy: [id number] at [redacted]w[redacted]d  1. Supervision of high risk pregnancy, antepartum   2. Post term pregnancy, antepartum condition or complication - IOL this week  3. Sickle cell trait (HCC)   4. Obesity in pregnancy   Term labor symptoms and general obstetric precautions including but not limited to vaginal bleeding, contractions, leaking of fluid and fetal movement were reviewed in detail with the patient. Please refer to After Visit  Summary for other counseling recommendations.  No follow-ups on file.  Future Appointments  Date Time Provider Department Center  11/10/2018  2:45 PM [facility] Rosalie Gums [facility]  11/12/2018  7:30 AM [facility] ROOM [facility] None    Allie Bossier, MD

## 2018-11-10 NOTE — Progress Notes (Signed)
IOL scheduled 2/5 @ 0730.   Patient and/or legal guardian verbally consented to meet with Behavioral Health Clinician about presenting concerns.    Pt informed that the ultrasound is considered a limited OB ultrasound and is not intended to be a complete ultrasound exam.  Patient also informed that the ultrasound is not being completed with the intent of assessing for fetal or placental anomalies or any pelvic abnormalities.  Explained that the purpose of today's ultrasound is to assess for presentation, BPP and amniotic fluid volume.  Patient acknowledges the purpose of the exam and the limitations of the study.

## 2018-11-12 ENCOUNTER — Inpatient Hospital Stay (HOSPITAL_COMMUNITY): Payer: Commercial Managed Care - PPO | Admitting: Anesthesiology

## 2018-11-12 ENCOUNTER — Inpatient Hospital Stay (HOSPITAL_COMMUNITY)
Admission: RE | Admit: 2018-11-12 | Discharge: 2018-11-15 | DRG: 798 | Disposition: A | Discharge status: Home / Self Care | Payer: Commercial Managed Care - PPO | Attending: Family Medicine | Admitting: Family Medicine

## 2018-11-12 ENCOUNTER — Encounter (HOSPITAL_COMMUNITY): Payer: Self-pay

## 2018-11-12 VITALS — BP 149/88 | HR 70 | Temp 98.5°F | Resp 18 | Ht 63.0 in | Wt 255.0 lb

## 2018-11-12 DIAGNOSIS — D573 Sickle-cell trait: Secondary | ICD-10-CM | POA: Diagnosis present

## 2018-11-12 DIAGNOSIS — O134 Gestational [pregnancy-induced] hypertension without significant proteinuria, complicating childbirth: Secondary | ICD-10-CM | POA: Diagnosis present

## 2018-11-12 DIAGNOSIS — O99214 Obesity complicating childbirth: Secondary | ICD-10-CM | POA: Diagnosis present

## 2018-11-12 DIAGNOSIS — O0990 Supervision of high risk pregnancy, unspecified, unspecified trimester: Secondary | ICD-10-CM

## 2018-11-12 DIAGNOSIS — O480 Post-term pregnancy: Secondary | ICD-10-CM | POA: Diagnosis present

## 2018-11-12 DIAGNOSIS — O99824 Streptococcus B carrier state complicating childbirth: Secondary | ICD-10-CM | POA: Diagnosis present

## 2018-11-12 DIAGNOSIS — O9902 Anemia complicating childbirth: Secondary | ICD-10-CM | POA: Diagnosis present

## 2018-11-12 DIAGNOSIS — Z3A41 41 weeks gestation of pregnancy: Secondary | ICD-10-CM | POA: Diagnosis not present

## 2018-11-12 DIAGNOSIS — Z87891 Personal history of nicotine dependence: Secondary | ICD-10-CM | POA: Diagnosis not present

## 2018-11-12 DIAGNOSIS — O99210 Obesity complicating pregnancy, unspecified trimester: Secondary | ICD-10-CM | POA: Diagnosis present

## 2018-11-12 DIAGNOSIS — O139 Gestational [pregnancy-induced] hypertension without significant proteinuria, unspecified trimester: Secondary | ICD-10-CM | POA: Diagnosis present

## 2018-11-12 DIAGNOSIS — Z302 Encounter for sterilization: Secondary | ICD-10-CM

## 2018-11-12 LAB — CBC
HCT: 35.3 % — ABNORMAL LOW (ref 36.0–46.0)
Hemoglobin: 11.6 g/dL — ABNORMAL LOW (ref 12.0–15.0)
MCH: 29.1 pg (ref 26.0–34.0)
MCHC: 32.9 g/dL (ref 30.0–36.0)
MCV: 88.5 fL (ref 80.0–100.0)
Platelets: 286 10*3/uL (ref 150–400)
RBC: 3.99 MIL/uL (ref 3.87–5.11)
RDW: 13.4 % (ref 11.5–15.5)
WBC: 8.5 10*3/uL (ref 4.0–10.5)
nRBC: 0 % (ref 0.0–0.2)

## 2018-11-12 LAB — PROTEIN / CREATININE RATIO, URINE
Creatinine, Urine: 59 mg/dL
Protein Creatinine Ratio: 0.1 mg/mg{Cre} (ref 0.00–0.15)
Total Protein, Urine: 6 mg/dL

## 2018-11-12 LAB — TYPE AND SCREEN
ABO/RH(D): O POS
Antibody Screen: NEGATIVE

## 2018-11-12 LAB — COMPREHENSIVE METABOLIC PANEL
ALT: 10 U/L (ref 0–44)
ANION GAP: 9 (ref 5–15)
AST: 21 U/L (ref 15–41)
Albumin: 3 g/dL — ABNORMAL LOW (ref 3.5–5.0)
Alkaline Phosphatase: 91 U/L (ref 38–126)
BUN: 7 mg/dL (ref 6–20)
CO2: 20 mmol/L — ABNORMAL LOW (ref 22–32)
Calcium: 8.9 mg/dL (ref 8.9–10.3)
Chloride: 105 mmol/L (ref 98–111)
Creatinine, Ser: 0.55 mg/dL (ref 0.44–1.00)
GFR calc Af Amer: >60 mL/min (ref >60)
GFR calc non Af Amer: >60 mL/min (ref >60)
Glucose, Bld: 109 mg/dL — ABNORMAL HIGH (ref 70–99)
Potassium: 3.8 mmol/L (ref 3.5–5.1)
Sodium: 134 mmol/L — ABNORMAL LOW (ref 135–145)
TOTAL PROTEIN: 6.8 g/dL (ref 6.5–8.1)
Total Bilirubin: 0.7 mg/dL (ref 0.3–1.2)

## 2018-11-12 MED ORDER — NALBUPHINE HCL 10 MG/ML IJ SOLN: 5.0000 mg | Freq: Once | INTRAMUSCULAR | Status: DC

## 2018-11-12 MED ORDER — OXYTOCIN 40 UNITS IN NORMAL SALINE INFUSION - SIMPLE MED: 2.5000 [IU]/h | INTRAVENOUS | Status: DC

## 2018-11-12 MED ORDER — LACTATED RINGERS IV SOLN
INTRAVENOUS | Status: DC
  Administered 2018-11-12 (×3): via INTRAVENOUS

## 2018-11-12 MED ORDER — FENTANYL 2.5 MCG/ML BUPIVACAINE 1/10 % EPIDURAL INFUSION (WH - ANES)
14.0000 mL/h | INTRAMUSCULAR | Status: DC | PRN
  Administered 2018-11-12 (×2): 14 mL/h via EPIDURAL
  Filled 2018-11-12 (×2): qty 100

## 2018-11-12 MED ORDER — PENICILLIN G 3 MILLION UNITS IVPB - SIMPLE MED
3.0000 10*6.[IU] | INTRAVENOUS | Status: DC
  Administered 2018-11-12 (×3): 3 10*6.[IU] via INTRAVENOUS
  Filled 2018-11-12 (×5): qty 100

## 2018-11-12 MED ORDER — MISOPROSTOL 50MCG HALF TABLET
50.0000 ug | ORAL_TABLET | ORAL | Status: DC | PRN
  Administered 2018-11-12 (×2): 50 ug via BUCCAL
  Filled 2018-11-12 (×2): qty 1

## 2018-11-12 MED ORDER — OXYCODONE-ACETAMINOPHEN 5-325 MG PO TABS: 2.0000 | ORAL_TABLET | ORAL | Status: DC | PRN

## 2018-11-12 MED ORDER — OXYTOCIN BOLUS FROM INFUSION
500.0000 mL | Freq: Once | INTRAVENOUS | Status: AC
  Administered 2018-11-13: 500 mL via INTRAVENOUS

## 2018-11-12 MED ORDER — EPHEDRINE 5 MG/ML INJ
10.0000 mg | INTRAVENOUS | Status: DC | PRN
  Filled 2018-11-12: qty 2

## 2018-11-12 MED ORDER — TERBUTALINE SULFATE 1 MG/ML IJ SOLN
0.2500 mg | Freq: Once | INTRAMUSCULAR | Status: AC | PRN
  Administered 2018-11-12: 0.25 mg via SUBCUTANEOUS
  Filled 2018-11-12: qty 1

## 2018-11-12 MED ORDER — LACTATED RINGERS IV SOLN
500.0000 mL | Freq: Once | INTRAVENOUS | Status: AC
  Administered 2018-11-12: 500 mL via INTRAVENOUS

## 2018-11-12 MED ORDER — OXYTOCIN 40 UNITS IN NORMAL SALINE INFUSION - SIMPLE MED
1.0000 m[IU]/min | INTRAVENOUS | Status: DC
  Filled 2018-11-12: qty 1000

## 2018-11-12 MED ORDER — SOD CITRATE-CITRIC ACID 500-334 MG/5ML PO SOLN
30.0000 mL | ORAL | Status: DC | PRN
  Filled 2018-11-12: qty 15

## 2018-11-12 MED ORDER — PHENYLEPHRINE 40 MCG/ML (10ML) SYRINGE FOR IV PUSH (FOR BLOOD PRESSURE SUPPORT)
80.0000 ug | PREFILLED_SYRINGE | INTRAVENOUS | Status: DC | PRN
  Filled 2018-11-12: qty 10

## 2018-11-12 MED ORDER — LACTATED RINGERS IV SOLN
500.0000 mL | INTRAVENOUS | Status: DC | PRN
  Administered 2018-11-12: 1000 mL via INTRAVENOUS

## 2018-11-12 MED ORDER — SODIUM CHLORIDE 0.9 % IV SOLN
5.0000 10*6.[IU] | Freq: Once | INTRAVENOUS | Status: AC
  Administered 2018-11-12: 5 10*6.[IU] via INTRAVENOUS
  Filled 2018-11-12: qty 5

## 2018-11-12 MED ORDER — ONDANSETRON HCL 4 MG/2ML IJ SOLN: 4.0000 mg | Freq: Four times a day (QID) | INTRAMUSCULAR | Status: DC | PRN

## 2018-11-12 MED ORDER — ACETAMINOPHEN 325 MG PO TABS: 650.0000 mg | ORAL_TABLET | ORAL | Status: DC | PRN

## 2018-11-12 MED ORDER — OXYCODONE-ACETAMINOPHEN 5-325 MG PO TABS: 1.0000 | ORAL_TABLET | ORAL | Status: DC | PRN

## 2018-11-12 MED ORDER — FENTANYL CITRATE (PF) 100 MCG/2ML IJ SOLN
100.0000 ug | INTRAMUSCULAR | Status: DC | PRN
  Administered 2018-11-12: 100 ug via INTRAVENOUS
  Filled 2018-11-12: qty 2

## 2018-11-12 MED ORDER — NALBUPHINE HCL 10 MG/ML IJ SOLN: 5.0000 mg | INTRAMUSCULAR | Status: DC | PRN

## 2018-11-12 MED ORDER — LIDOCAINE HCL (PF) 1 % IJ SOLN
30.0000 mL | INTRAMUSCULAR | Status: DC | PRN
  Filled 2018-11-12: qty 30

## 2018-11-12 MED ORDER — OXYTOCIN 40 UNITS IN NORMAL SALINE INFUSION - SIMPLE MED
1.0000 m[IU]/min | INTRAVENOUS | Status: DC
  Administered 2018-11-12: 2 m[IU]/min via INTRAVENOUS

## 2018-11-12 MED ORDER — DIPHENHYDRAMINE HCL 50 MG/ML IJ SOLN
12.5000 mg | INTRAMUSCULAR | Status: DC | PRN
  Administered 2018-11-12: 12.5 mg via INTRAVENOUS
  Filled 2018-11-12: qty 1

## 2018-11-12 MED ORDER — PHENYLEPHRINE 40 MCG/ML (10ML) SYRINGE FOR IV PUSH (FOR BLOOD PRESSURE SUPPORT)
80.0000 ug | PREFILLED_SYRINGE | INTRAVENOUS | Status: DC | PRN
  Filled 2018-11-12 (×2): qty 10

## 2018-11-12 MED ORDER — TERBUTALINE SULFATE 1 MG/ML IJ SOLN: 0.2500 mg | Freq: Once | INTRAMUSCULAR | Status: DC | PRN

## 2018-11-12 MED ORDER — LIDOCAINE HCL (PF) 1 % IJ SOLN
INTRAMUSCULAR | Status: DC | PRN
  Administered 2018-11-12: 4 mL via EPIDURAL
  Administered 2018-11-12: 6 mL via EPIDURAL

## 2018-11-12 NOTE — Anesthesia Pain Management Evaluation Note (Signed)
  CRNA Pain Management Visit Note  Patient: Kara Myers, 28 y.o., female  "Hello I am a member of the anesthesia team at [facility]. We have an anesthesia team available at all times to provide care throughout the hospital, including epidural management and anesthesia for C-section. I don't know your plan for the delivery whether it a natural birth, water birth, IV sedation, nitrous supplementation, doula or epidural, but we want to meet your pain goals."   1.Was your pain managed to your expectations on prior hospitalizations?   Yes   2.What is your expectation for pain management during this hospitalization?     Epidural  3.How can we help you reach that goal? Epidural when appropriate  Record the patient's initial score and the patient's pain goal.   Pain: 1  Pain Goal: 3 [facility] wants you to be able to say your pain was always managed very well.  Cleda Clarks 11/12/2018

## 2018-11-12 NOTE — Anesthesia Preprocedure Evaluation (Signed)
Anesthesia Evaluation  Patient identified by MRN, date of birth, ID band Patient awake    Reviewed: Allergy & Precautions, NPO status , Patient's Chart, lab work & pertinent test results  History of Anesthesia Complications Negative for: history of anesthetic complications  Airway Mallampati: II  TM Distance: >3 FB Neck ROM: Full    Dental   Pulmonary former smoker,    breath sounds clear to auscultation       Cardiovascular hypertension,  Rhythm:Regular Rate:Normal     Neuro/Psych negative neurological ROS  negative psych ROS   GI/Hepatic negative GI ROS, Neg liver ROS,   Endo/Other  Morbid obesity  Renal/GU negative Renal ROS     Musculoskeletal negative musculoskeletal ROS (+)   Abdominal (+) + obese,   Peds  Hematology  (+) Sickle cell trait and anemia ,   Anesthesia Other Findings HSV  Reproductive/Obstetrics (+) Pregnancy                             Anesthesia Physical Anesthesia Plan  ASA: III  Anesthesia Plan: Epidural   Post-op Pain Management:    Induction:   PONV Risk Score and Plan: 2 and Treatment may vary due to age or medical condition  Airway Management Planned: Natural Airway  Additional Equipment: None  Intra-op Plan:   Post-operative Plan:   Informed Consent: I have reviewed the patients History and Physical, chart, labs and discussed the procedure including the risks, benefits and alternatives for the proposed anesthesia with the patient or authorized representative who has indicated his/her understanding and acceptance.       Plan Discussed with: Anesthesiologist  Anesthesia Plan Comments: (Labs reviewed. Platelets acceptable, patient not taking any blood thinning medications. Per RN, FHR tracing reported to be stable enough for sitting procedure. Risks and benefits discussed with patient, including PDPH, backache, epidural hematoma, failed epidural,  allergic reaction, and nerve injury. Patient expressed understanding and wished to proceed.)        Anesthesia Quick Evaluation

## 2018-11-12 NOTE — Anesthesia Procedure Notes (Signed)
Epidural Patient location during procedure: OB Start time: 11/12/2018 4:23 PM End time: 11/12/2018 4:27 PM  Staffing Anesthesiologist: Beryle Lathe, MD Performed: anesthesiologist   Preanesthetic Checklist Completed: patient identified, pre-op evaluation, timeout performed, IV checked, risks and benefits discussed and monitors and equipment checked  Epidural Patient position: sitting Prep: DuraPrep Patient monitoring: continuous pulse ox and blood pressure Approach: midline Location: L2-L3 Injection technique: LOR saline  Needle:  Needle type: Tuohy  Needle gauge: 17 G Needle length: 9 cm Needle insertion depth: 8 cm Catheter size: 19 Gauge Catheter at skin depth: 13 cm Test dose: negative and Other (1% lidocaine)  Assessment Events: blood not aspirated  Additional Notes Patient identified. Risks including, but not limited to, bleeding, infection, nerve damage, paralysis, inadequate analgesia, blood pressure changes, nausea, vomiting, allergic reaction, postpartum back pain, itching, and headache were discussed. Patient expressed understanding and wished to proceed. Sterile prep and drape, including hand hygiene, mask, and sterile gloves were used. The patient was positioned and the spine was prepped. The skin was anesthetized with lidocaine. No paraesthesia or other complication noted. The patient did not experience any signs of intravascular injection such as tinnitus or metallic taste in mouth, nor signs of intrathecal spread such as rapid motor block. Please see nursing notes for vital signs. The patient tolerated the procedure well.   Leslye Peer, MDReason for block:procedure for pain

## 2018-11-12 NOTE — Progress Notes (Signed)
I have reviewed this chart and agree with the RN/CMA assessment and management.    Mahari Vankirk C Moesha Sarchet, MD, FACOG Attending Physician, [facility]  

## 2018-11-12 NOTE — Progress Notes (Signed)
LABOR PROGRESS NOTE  Kara Myers is a 28 y.o. [id number] at 6031w0d  admitted for IOL for post dates.  Subjective: Comfortable and resting well. She states she feels a little bit of pressure with contractions but no pain. No complaints at this time.  Objective: BP (!) 144/84   Pulse 65   Temp 98.7 F (37.1 C)   Resp 16   Ht 5\' 3"  (1.6 m)   Wt 115.7 kg   LMP 01/29/2018 (Approximate)   SpO2 100%   BMI 45.17 kg/m  or  Vitals:   11/12/18 1645 11/12/18 1650 11/12/18 1655 11/12/18 1700  BP: (!) 147/84 (!) 147/90 (!) 141/95 (!) 144/84  Pulse: 79 71 75 65  Resp: 16 16 18 16   Temp:      TempSrc:      SpO2: 100% 100%    Weight:      Height:        Dilation: 4.5 Effacement (%): 70 Cervical Position: Posterior Station: -3 Presentation: Vertex Exam by:: Lorn Junes. Goodman, RN FHT: baseline rate 115, moderate varibility, 15x15 acel, variable decel Toco: Q4-216min  Labs: Lab Results  Component Value Date   WBC 8.5 11/12/2018   HGB 11.6 (L) 11/12/2018   HCT 35.3 (L) 11/12/2018   MCV 88.5 11/12/2018   PLT 286 11/12/2018    Patient Active Problem List   Diagnosis Date Noted  . Post-dates pregnancy 11/12/2018  . BMI 40.0-44.9, adult (HCC) 10/30/2018  . Sickle cell trait (HCC) 07/14/2018  . History of gestational hypertension 07/02/2018  . Supervision of high risk pregnancy, antepartum 07/02/2018  . Obesity in pregnancy 07/02/2018    Assessment / Plan: 28 y.o. [id number] at 4931w0d here for IOL for post dates  Labor: Latent, FB out, plan to augment with pitocin after the 4 hour post-cytotec wait period. Fetal Wellbeing:  Category 1 Pain Control:  Epidural in place. Anticipated MOD:  SVD  Wilnette KalesXan Makayah Pauli, D.O. 11/12/2018, 5:39 PM

## 2018-11-12 NOTE — H&P (Signed)
LABOR AND DELIVERY ADMISSION HISTORY AND PHYSICAL NOTE  Kara Myers is a 28 y.o. female [phone] with IUP at [redacted]w[redacted]d by LMP presenting for IOL for post dates.  She reports positive fetal movement. She denies leakage of fluid or vaginal bleeding.  Prenatal History/Complications: PNC at [facility] Pregnancy complications:  - obesity, history of gestational HTN in previous pregnancy, sickle cell trait  Past Medical History: Past Medical History:  Diagnosis Date  . Herpes simplex    last outbreak in 2010  . Pregnancy induced hypertension   . Sickle cell trait (HCC)   . Urinary tract infection     Past Surgical History: Past Surgical History:  Procedure Laterality Date  . DILATION AND CURETTAGE OF UTERUS      Obstetrical History: OB History    Gravida  5   Para  2   Term  2   Preterm      AB  2   Living  2     SAB      TAB  2   Ectopic      Multiple      Live Births  2           Social History: Social History   Socioeconomic History  . Marital status: Single    Spouse name: Not on file  . Number of children: Not on file  . Years of education: Not on file  . Highest education level: Not on file  Occupational History  . Not on file  Social Needs  . Financial resource strain: Not hard at all  . Food insecurity:    Worry: Never true    Inability: Never true  . Transportation needs:    Medical: No    Non-medical: No  Tobacco Use  . Smoking status: Former Smoker    Last attempt to quit: 03/08/2018    Years since quitting: 0.6  . Smokeless tobacco: Never Used  . Tobacco comment: 1-2 cig/day  Substance and Sexual Activity  . Alcohol use: Not Currently  . Drug use: Not Currently    Types: Marijuana    Comment: states does not smoke weed now  . Sexual activity: Yes    Birth control/protection: None  Lifestyle  . Physical activity:    Days per week: Not on file    Minutes per session: Not on file  . Stress: To some extent  Relationships  .  Social connections:    Talks on phone: Not on file    Gets together: Not on file    Attends religious service: Not on file    Active member of club or organization: Not on file    Attends meetings of clubs or organizations: Not on file    Relationship status: Not on file  Other Topics Concern  . Not on file  Social History Narrative  . Not on file    Family History: Family History  Problem Relation Age of Onset  . Diabetes Mother   . Diabetes Father     Allergies: No Known Allergies  Medications Prior to Admission  Medication Sig Dispense Refill Last Dose  . aspirin EC 81 MG tablet Take 1 tablet (81 mg total) by mouth daily. 60 tablet 3 Taking  . Prenatal Vit-Fe Fumarate-FA (PRENATAL VITAMIN) 27-0.8 MG TABS Take 1 tablet by mouth daily. 60 tablet 3 Taking     Review of Systems  All systems reviewed and negative except as stated in HPI  Physical Exam Blood pressure Marland Kitchen)  145/99, pulse 88, temperature 98 F (36.7 C), temperature source Oral, resp. rate 16, height 5\' 3"  (1.6 m), weight 115.7 kg, last menstrual period 01/29/2018, unknown if currently breastfeeding. General appearance: alert, oriented, NAD Lungs: normal respiratory effort Heart: regular rate Abdomen: soft, non-tender; gravid, FH appropriate for GA Extremities: No calf swelling or tenderness Presentation: cephalic Fetal monitoring: 125bpm baseline, moderate variability, 15x15 accels, no decels Uterine activity: rare Dilation: Closed Exam by:: Ayani Ospina,CNM  Prenatal labs: ABO, Rh: O/Positive/-- (09/25 1142) Antibody: Negative (09/25 1142) Rubella: 1.56 (09/25 1142) RPR: Non Reactive (11/07 0957)  HBsAg: Negative (09/25 1142)  HIV: Non Reactive (11/07 0957)  GC/Chlamydia: negative/negative GBS:   positive 2-hr GTT: 69/95/97 Genetic screening:  declined Anatomy US: normal  Prenatal Transfer Tool  Maternal Diabetes: No Genetic Screening: Declined Maternal Ultrasounds/Referrals: Normal Fetal  Ultrasounds or other Referrals:  None Maternal Substance Abuse:  No Significant Maternal Medications:  None Significant Maternal Lab Results: Lab values include: Group B Strep positive  Results for orders placed or performed during the hospital encounter of 11/12/18 (from the past 24 hour(s))  CBC   Collection Time: 11/12/18  9:32 AM  Result Value Ref Range   WBC 8.5 4.0 - 10.5 K/uL   RBC 3.99 3.87 - 5.11 MIL/uL   Hemoglobin 11.6 (L) 12.0 - 15.0 g/dL   HCT 40.935.3 (L) 81.136.0 - 91.446.0 %   MCV 88.5 80.0 - 100.0 fL   MCH 29.1 26.0 - 34.0 pg   MCHC 32.9 30.0 - 36.0 g/dL   RDW 78.213.4 95.611.5 - 21.315.5 %   Platelets 286 150 - 400 K/uL   nRBC 0.0 0.0 - 0.2 %    Patient Active Problem List   Diagnosis Date Noted  . Post-dates pregnancy 11/12/2018  . BMI 40.0-44.9, adult (HCC) 10/30/2018  . Sickle cell trait (HCC) 07/14/2018  . History of gestational hypertension 07/02/2018  . Supervision of high risk pregnancy, antepartum 07/02/2018  . Obesity in pregnancy 07/02/2018    Assessment: Kara Myers is a 28 y.o. [id number] at 663w0d here for IOL for post dates.  #Labor: latent #Pain: Plans for epidural #FWB: Category 1 #ID:  GBS positive #MOF: breast and bottle #MOC: BTL #Circ:  N/A  Midwife attestation: I have seen and examined this patient; I agree with above documentation in the resident's note.   PE: Gen: calm comfortable, NAD Resp: normal effort and rate Abd: gravid, EFW by leopolds 8.5 lbs  ROS, labs, PMH reviewed  Assessment/Plan: Kara Myers is a 28 y.o. [phone] here for IOL Admit to LD Labor: latent FWB: Cat I GBS pos Admit to BS Cytotec then FB when able PCN Anticipate SVD  Kara Myers, CNM  11/12/2018, 11:26 AM

## 2018-11-12 NOTE — Progress Notes (Signed)
Labor Progress Note Kara Myers is a 28 y.o. [id number] at [redacted]w[redacted]d presented for IOL for post dates  S:  Comfortable, feeling some ctx.  O:  BP 134/82   Pulse 81   Temp 98 F (36.7 C) (Oral)   Resp 16   Ht 5\' 3"  (1.6 m)   Wt 115.7 kg   LMP 01/29/2018 (Approximate)   BMI 45.17 kg/m  EFM: baseline 120 bpm/ mod variability/ + accels/ no decels  Toco: irregular SVE: deferred  [name]: 28 y.o. [name] [redacted]w[redacted]d  1. Labor: latent 2. FWB: Cat I 3. Pain: analgesia/anesthesia prn  Continue Cytotec ripening, FB when able. BP improved, PEC labs neg. Anticipate SVD.  [name], CNM 1:18 PM

## 2018-11-13 ENCOUNTER — Inpatient Hospital Stay (HOSPITAL_COMMUNITY): Payer: Commercial Managed Care - PPO | Admitting: Anesthesiology

## 2018-11-13 ENCOUNTER — Encounter (HOSPITAL_COMMUNITY): Payer: Self-pay

## 2018-11-13 ENCOUNTER — Encounter (HOSPITAL_COMMUNITY)
Admission: RE | Disposition: A | Discharge status: Home / Self Care | Payer: Self-pay | Source: Home / Self Care | Attending: Family Medicine

## 2018-11-13 DIAGNOSIS — Z302 Encounter for sterilization: Secondary | ICD-10-CM

## 2018-11-13 DIAGNOSIS — O99824 Streptococcus B carrier state complicating childbirth: Secondary | ICD-10-CM

## 2018-11-13 DIAGNOSIS — Z3A41 41 weeks gestation of pregnancy: Secondary | ICD-10-CM

## 2018-11-13 DIAGNOSIS — O480 Post-term pregnancy: Secondary | ICD-10-CM

## 2018-11-13 HISTORY — PX: TUBAL LIGATION: SHX77

## 2018-11-13 LAB — RPR: RPR Ser Ql: NONREACTIVE

## 2018-11-13 SURGERY — LIGATION, FALLOPIAN TUBE, POSTPARTUM
Anesthesia: Epidural

## 2018-11-13 MED ORDER — DIPHENHYDRAMINE HCL 25 MG PO CAPS: 25.0000 mg | ORAL_CAPSULE | Freq: Four times a day (QID) | ORAL | Status: DC | PRN

## 2018-11-13 MED ORDER — ONDANSETRON HCL 4 MG/2ML IJ SOLN
INTRAMUSCULAR | Status: AC
  Filled 2018-11-13: qty 2

## 2018-11-13 MED ORDER — COCONUT OIL OIL: 1.0000 "application " | TOPICAL_OIL | Status: DC | PRN

## 2018-11-13 MED ORDER — FAMOTIDINE 20 MG PO TABS
40.0000 mg | ORAL_TABLET | Freq: Once | ORAL | Status: AC
  Administered 2018-11-13: 40 mg via ORAL
  Filled 2018-11-13: qty 2

## 2018-11-13 MED ORDER — PRENATAL MULTIVITAMIN CH
1.0000 | ORAL_TABLET | Freq: Every day | ORAL | Status: DC
  Administered 2018-11-14 – 2018-11-15 (×2): 1 via ORAL
  Filled 2018-11-13 (×2): qty 1

## 2018-11-13 MED ORDER — IBUPROFEN 600 MG PO TABS
600.0000 mg | ORAL_TABLET | Freq: Four times a day (QID) | ORAL | Status: DC
  Administered 2018-11-13 – 2018-11-15 (×8): 600 mg via ORAL
  Filled 2018-11-13 (×8): qty 1

## 2018-11-13 MED ORDER — LACTATED RINGERS IV SOLN
INTRAVENOUS | Status: DC
  Administered 2018-11-13: 09:00:00 via INTRAVENOUS

## 2018-11-13 MED ORDER — MEASLES, MUMPS & RUBELLA VAC IJ SOLR
0.5000 mL | Freq: Once | INTRAMUSCULAR | Status: DC
  Filled 2018-11-13: qty 0.5

## 2018-11-13 MED ORDER — OXYCODONE HCL 5 MG PO TABS: 5.0000 mg | ORAL_TABLET | Freq: Once | ORAL | Status: DC | PRN

## 2018-11-13 MED ORDER — DIBUCAINE 1 % RE OINT: 1.0000 "application " | TOPICAL_OINTMENT | RECTAL | Status: DC | PRN

## 2018-11-13 MED ORDER — SENNOSIDES-DOCUSATE SODIUM 8.6-50 MG PO TABS
2.0000 | ORAL_TABLET | ORAL | Status: DC
  Administered 2018-11-13 – 2018-11-14 (×2): 2 via ORAL
  Filled 2018-11-13 (×2): qty 2

## 2018-11-13 MED ORDER — TETANUS-DIPHTH-ACELL PERTUSSIS 5-2.5-18.5 LF-MCG/0.5 IM SUSP: 0.5000 mL | Freq: Once | INTRAMUSCULAR | Status: DC

## 2018-11-13 MED ORDER — FENTANYL CITRATE (PF) 100 MCG/2ML IJ SOLN
INTRAMUSCULAR | Status: DC | PRN
  Administered 2018-11-13: 100 ug via EPIDURAL

## 2018-11-13 MED ORDER — OXYCODONE-ACETAMINOPHEN 5-325 MG PO TABS
2.0000 | ORAL_TABLET | ORAL | Status: DC | PRN
  Filled 2018-11-13: qty 2

## 2018-11-13 MED ORDER — SIMETHICONE 80 MG PO CHEW
80.0000 mg | CHEWABLE_TABLET | ORAL | Status: DC | PRN
  Administered 2018-11-13: 80 mg via ORAL
  Filled 2018-11-13: qty 1

## 2018-11-13 MED ORDER — ONDANSETRON HCL 4 MG PO TABS: 4.0000 mg | ORAL_TABLET | ORAL | Status: DC | PRN

## 2018-11-13 MED ORDER — LIDOCAINE-EPINEPHRINE (PF) 2 %-1:200000 IJ SOLN
INTRAMUSCULAR | Status: DC | PRN
  Administered 2018-11-13: 7 mL via EPIDURAL

## 2018-11-13 MED ORDER — ONDANSETRON HCL 4 MG/2ML IJ SOLN: 4.0000 mg | Freq: Four times a day (QID) | INTRAMUSCULAR | Status: DC | PRN

## 2018-11-13 MED ORDER — OXYCODONE HCL 5 MG/5ML PO SOLN: 5.0000 mg | Freq: Once | ORAL | Status: DC | PRN

## 2018-11-13 MED ORDER — BENZOCAINE-MENTHOL 20-0.5 % EX AERO
1.0000 "application " | INHALATION_SPRAY | CUTANEOUS | Status: DC | PRN
  Administered 2018-11-13: 1 via TOPICAL
  Filled 2018-11-13: qty 56

## 2018-11-13 MED ORDER — WITCH HAZEL-GLYCERIN EX PADS: 1.0000 "application " | MEDICATED_PAD | CUTANEOUS | Status: DC | PRN

## 2018-11-13 MED ORDER — OXYCODONE-ACETAMINOPHEN 5-325 MG PO TABS
1.0000 | ORAL_TABLET | ORAL | Status: DC | PRN
  Administered 2018-11-13 (×2): 1 via ORAL
  Filled 2018-11-13 (×2): qty 1

## 2018-11-13 MED ORDER — BUPIVACAINE HCL (PF) 0.25 % IJ SOLN
INTRAMUSCULAR | Status: DC | PRN
  Administered 2018-11-13: 6 mL
  Administered 2018-11-13: .5 mL

## 2018-11-13 MED ORDER — BUPIVACAINE HCL (PF) 0.25 % IJ SOLN
INTRAMUSCULAR | Status: AC
  Filled 2018-11-13: qty 10

## 2018-11-13 MED ORDER — LIDOCAINE-EPINEPHRINE (PF) 2 %-1:200000 IJ SOLN
INTRAMUSCULAR | Status: AC
  Filled 2018-11-13: qty 20

## 2018-11-13 MED ORDER — SODIUM BICARBONATE 8.4 % IV SOLN
INTRAVENOUS | Status: DC | PRN
  Administered 2018-11-13 (×4): 5 mL via EPIDURAL

## 2018-11-13 MED ORDER — ACETAMINOPHEN 325 MG PO TABS
650.0000 mg | ORAL_TABLET | ORAL | Status: DC | PRN
  Administered 2018-11-15: 650 mg via ORAL
  Filled 2018-11-13: qty 2

## 2018-11-13 MED ORDER — FENTANYL CITRATE (PF) 100 MCG/2ML IJ SOLN: 25.0000 ug | INTRAMUSCULAR | Status: DC | PRN

## 2018-11-13 MED ORDER — METOCLOPRAMIDE HCL 10 MG PO TABS
10.0000 mg | ORAL_TABLET | Freq: Once | ORAL | Status: AC
  Administered 2018-11-13: 10 mg via ORAL
  Filled 2018-11-13: qty 1

## 2018-11-13 MED ORDER — ONDANSETRON HCL 4 MG/2ML IJ SOLN
4.0000 mg | INTRAMUSCULAR | Status: DC | PRN
  Administered 2018-11-13: 4 mg via INTRAVENOUS

## 2018-11-13 MED ORDER — FENTANYL CITRATE (PF) 100 MCG/2ML IJ SOLN
INTRAMUSCULAR | Status: AC
  Filled 2018-11-13: qty 2

## 2018-11-13 SURGICAL SUPPLY — 24 items
BLADE SURG 11 STRL SS (BLADE) ×3 IMPLANT
CLIP FILSHIE TUBAL LIGA STRL (Clip) ×5 IMPLANT
CLOTH BEACON ORANGE TIMEOUT ST (SAFETY) ×3 IMPLANT
DRESSING OPSITE X SMALL 2X3 (GAUZE/BANDAGES/DRESSINGS) ×2 IMPLANT
DRSG OPSITE POSTOP 3X4 (GAUZE/BANDAGES/DRESSINGS) ×3 IMPLANT
DURAPREP 26ML APPLICATOR (WOUND CARE) ×3 IMPLANT
GLOVE BIO SURGEON STRL SZ 6.5 (GLOVE) ×2 IMPLANT
GLOVE BIO SURGEONS STRL SZ 6.5 (GLOVE) ×1
GLOVE BIOGEL PI IND STRL 7.0 (GLOVE) ×2 IMPLANT
GLOVE BIOGEL PI INDICATOR 7.0 (GLOVE) ×4
GOWN STRL REUS W/TWL LRG LVL3 (GOWN DISPOSABLE) ×6 IMPLANT
NEEDLE HYPO 22GX1.5 SAFETY (NEEDLE) ×3 IMPLANT
NS IRRIG 1000ML POUR BTL (IV SOLUTION) ×3 IMPLANT
PACK ABDOMINAL MINOR (CUSTOM PROCEDURE TRAY) ×3 IMPLANT
PROTECTOR NERVE ULNAR (MISCELLANEOUS) ×3 IMPLANT
SPONGE LAP 18X18 X RAY DECT (DISPOSABLE) ×2 IMPLANT
SPONGE LAP 4X18 RFD (DISPOSABLE) IMPLANT
SUT VIC AB 0 CT1 27 (SUTURE) ×3
SUT VIC AB 0 CT1 27XBRD ANBCTR (SUTURE) ×1 IMPLANT
SUT VICRYL 4-0 PS2 18IN ABS (SUTURE) ×3 IMPLANT
SYR CONTROL 10ML LL (SYRINGE) ×3 IMPLANT
TOWEL OR 17X24 6PK STRL BLUE (TOWEL DISPOSABLE) ×6 IMPLANT
TRAY FOLEY CATH SILVER 16FR (SET/KITS/TRAYS/PACK) ×3 IMPLANT
WATER STERILE IRR 1000ML POUR (IV SOLUTION) ×3 IMPLANT

## 2018-11-13 NOTE — Lactation Note (Signed)
This note was copied from a baby's chart. Lactation Consultation Note  Patient Name: Girl Kaitelyn Abramo FVCBS'W Date: 11/13/2018 Reason for consult: Initial assessment;Term  80 hours old FT female who is being partially BF and formula fed by her mother, she's a P3 and somehow experienced BF. She was able to BF her first child for 3 weeks and her second one for 1-2 months. Mom participated in the [facility] program at the [facility] ans she's already familiar with hand expression and has observed small droplets of colostrum. She doesn't have a pump at home, [facility] offered one from the hospital. Pump instructions, cleaning and storage were reviewed. LC had to get a # 27 flange per mom's request, she may need it once her milk comes in.  Offered assistance with latch and mom agreed to wake baby up to feed but she won't do STS. She did remove the blanket though but not the onesie. Mom also removed baby's pacifier, explained to her how pacifiers can hurt breastfeeding when it's not well established yet but mom voiced she plans on going back to work in 6 weeks so she won't be BF this baby beyond that point, maybe she'll feed morning/night for comfort.   Mom put baby to the right breast in football position and she was able to latch almost right away with a few audible swallows heard. Mom needed very little assistance, baby still feeding at the 9 minutes mark when exiting the room. Reviewed normal newborn behavior and cluster feeding.  Feeding plan:  1. Encouraged mom to feed baby STS 8-12 times/24 hours or sooner if feeding cues are present 2. Hand expression and spoon feeding was also encouraged 3. Mom will continue supplementing baby with Gerber gentle according to supplementation guidelines   BF brochure, BF resources and feeding diary were reviewed. Parents reported all questions and concerns were answered, they're both aware of LC services and will call PRN.  Maternal Data Formula Feeding for Exclusion:  Yes Reason for exclusion: Mother's choice to formula and breast feed on admission Has patient been taught Hand Expression?: Yes Does the patient have breastfeeding experience prior to this delivery?: Yes  Feeding Feeding Type: Breast Fed  LATCH Score Latch: Grasps breast easily, tongue down, lips flanged, rhythmical sucking.  Audible Swallowing: A few with stimulation  Type of Nipple: Everted at rest and after stimulation  Comfort (Breast/Nipple): Soft / non-tender  Hold (Positioning): Assistance needed to correctly position infant at breast and maintain latch.  LATCH Score: 8  Interventions Interventions: Breast feeding basics reviewed;Assisted with latch;Breast compression;Adjust position;Support pillows;Hand pump  Lactation Tools Discussed/Used Tools: Pump;Flanges Flange Size: 27 Breast pump type: Manual WIC Program: Yes Pump Review: Setup, frequency, and cleaning Initiated by:: MPeck Date initiated:: 11/13/18   Consult Status Consult Status: PRN Follow-up type: In-patient    Karrington Mccravy Venetia Constable 11/13/2018, 10:10 PM

## 2018-11-13 NOTE — Anesthesia Postprocedure Evaluation (Signed)
Anesthesia Post Note  Patient: Kara Myers  Procedure(s) Performed: AN AD HOC LABOR EPIDURAL     Patient location during evaluation: Mother Baby Anesthesia Type: Epidural Level of consciousness: awake and alert and oriented Pain management: satisfactory to patient Vital Signs Assessment: post-procedure vital signs reviewed and stable Respiratory status: spontaneous breathing and nonlabored ventilation Cardiovascular status: stable Postop Assessment: no headache, no backache, no signs of nausea or vomiting, adequate PO intake, patient able to bend at knees and able to ambulate (patient up walking) Anesthetic complications: no    Last Vitals:  Vitals:   11/13/18 0743 11/13/18 1001  BP: 132/81 127/70  Pulse: 79 82  Resp: 20 18  Temp: 37.1 C 37.1 C  SpO2: 97% 97%    Last Pain:  Vitals:   11/13/18 1001  TempSrc: Oral  PainSc:    Pain Goal:                   Fabiha Rougeau

## 2018-11-13 NOTE — Discharge Summary (Addendum)
Postpartum Discharge Summary     Patient Name: Kara Myers DOB: 02/16/1991 MRN: [id number]  Date of admission: 11/12/2018 Delivering Provider: Gwenevere Abbot   Date of discharge: 11/15/2018  Admitting diagnosis: 28 WKS INDUCTION Intrauterine pregnancy: [redacted]w[redacted]d     Secondary diagnosis:  Active Problems:   Obesity in pregnancy   Sickle cell trait (HCC)   Post-dates pregnancy   Gestational hypertension  Additional problems: none     Discharge diagnosis: Term Pregnancy Delivered and Gestational Hypertension                                                                                                Post partum procedures:postpartum tubal ligation  Augmentation: Pitocin, Cytotec and Foley Balloon  Complications: None  Hospital course:  Induction of Labor With Vaginal Delivery   28 y.o. yo [id number] at [redacted]w[redacted]d was admitted to the hospital 11/12/2018 for induction of labor.  Indication for induction: Postdates. She was also noted to have elevated BPs on admit with neg pre-e labs. BPs did not enter the severe range. Patient had an uncomplicated labor course as follows: Membrane Rupture Time/Date: 9:01 PM ,11/12/2018   Intrapartum Procedures: Episiotomy: None [1]                                         Lacerations:  None [1]  Patient had delivery of a Viable infant.  Information for the patient's newborn:  Jasmere, Tufte [[id number]]  Delivery Method: Vaginal, Spontaneous(Filed from Delivery Summary)   11/13/2018  Details of delivery can be found in separate delivery note.  Patient had a postpartum course remarkable for having a ppBTL on PPD#0- tolerated it well. She had some elevated BPs on PPD#2/POD#2 and was started on Norvasc 5mg  prior to discharge. Patient is discharged home 11/15/18.   Magnesium Sulfate recieved: No BMZ received: No  Physical exam  Vitals:   11/14/18 0100 11/14/18 0526 11/14/18 2135 11/15/18 0545  BP: 125/72 122/74 (!) 139/92 (!) 143/72  Pulse: 69 68  60 74  Resp: 16 18 20 18   Temp:  98 F (36.7 C) 97.8 F (36.6 C) 98.5 F (36.9 C)  TempSrc:  Oral Oral Oral  SpO2:  98%    Weight:      Height:       General: alert, cooperative and no distress Lochia: appropriate Uterine Fundus: firm Incision: Healing well with no significant drainage, No significant erythema, Dressing is clean, dry, and intact DVT Evaluation: No evidence of DVT seen on physical exam. Labs: Lab Results  Component Value Date   WBC 8.5 11/12/2018   HGB 11.6 (L) 11/12/2018   HCT 35.3 (L) 11/12/2018   MCV 88.5 11/12/2018   PLT 286 11/12/2018   CMP Latest Ref Rng & Units 11/12/2018  Glucose 70 - 99 mg/dL 425(Z)  BUN 6 - 20 mg/dL 7  Creatinine 5.63 - 8.75 mg/dL 6.43  Sodium 329 - 518 mmol/L 134(L)  Potassium 3.5 - 5.1 mmol/L 3.8  Chloride 98 - 111 mmol/L  105  CO2 22 - 32 mmol/L 20(L)  Calcium 8.9 - 10.3 mg/dL 8.9  Total Protein 6.5 - 8.1 g/dL 6.8  Total Bilirubin 0.3 - 1.2 mg/dL 0.7  Alkaline Phos 38 - 126 U/L 91  AST 15 - 41 U/L 21  ALT 0 - 44 U/L 10    Discharge instruction: per After Visit Summary and "Baby and Me Booklet".  After visit meds:  Allergies as of 11/15/2018   No Known Allergies     Medication List    TAKE these medications   amLODipine 5 MG tablet Commonly known as:  NORVASC Take 1 tablet (5 mg total) by mouth daily.   aspirin EC 81 MG tablet Take 1 tablet (81 mg total) by mouth daily.   ibuprofen 600 MG tablet Commonly known as:  ADVIL,MOTRIN Take 1 tablet (600 mg total) by mouth every 6 (six) hours as needed.   oxyCODONE-acetaminophen 5-325 MG tablet Commonly known as:  PERCOCET/ROXICET Take 1 tablet by mouth every 4 (four) hours as needed (pain scale 4-7).   Prenatal Vitamin 27-0.8 MG Tabs Take 1 tablet by mouth daily.       Diet: low salt diet  Activity: Advance as tolerated. Pelvic rest for 6 weeks.   Outpatient follow up:BP check in 1 wk; PP visit in 4 wks Follow up Appt:No future appointments. Follow up  Visit: Follow-up Information    Center for [facility]. Schedule an appointment as soon as possible for a visit.   Specialty:  Obstetrics and Gynecology Why:  in 1 week for a blood pressure check, then in 4 weeks for your postpartum visit Contact information: [address] [place] [address] [phone]         Please schedule this patient for Postpartum visit in: 6 weeks with the following provider: Any provider For C/S patients schedule nurse incision check in weeks 2 weeks: no Low risk pregnancy complicated by: intrapartum gHTN Delivery mode:  SVD Anticipated Birth Control:  BTL done PP PP Procedures needed: BP check  Schedule Integrated BH visit: no  Newborn Data: Live born female  Birth Weight: 3036gm APGAR: 9, 9  Newborn Delivery   Birth date/time:  11/13/2018 02:27:00 Delivery type:  Vaginal, Spontaneous     Baby Feeding: both Disposition:home with mother   11/15/2018 Arabella Merles, CNM  8:57 AM

## 2018-11-13 NOTE — Anesthesia Postprocedure Evaluation (Signed)
Anesthesia Post Note  Patient: Kara Myers  Procedure(s) Performed: POST PARTUM TUBAL LIGATION (N/A )     Patient location during evaluation: PACU Anesthesia Type: Epidural and MAC Level of consciousness: awake and alert Pain management: pain level controlled Vital Signs Assessment: post-procedure vital signs reviewed and stable Respiratory status: spontaneous breathing, nonlabored ventilation, respiratory function stable and patient connected to nasal cannula oxygen Cardiovascular status: stable and blood pressure returned to baseline Postop Assessment: no apparent nausea or vomiting Anesthetic complications: no    Last Vitals:  Vitals:   11/13/18 1300 11/13/18 1311  BP: 134/71 117/73  Pulse: 71 77  Resp: 11 16  Temp:  37 C  SpO2: 97% 98%    Last Pain:  Vitals:   11/13/18 1311  TempSrc: Oral  PainSc: 0-No pain   Pain Goal:                   Koven Belinsky S

## 2018-11-13 NOTE — Op Note (Signed)
Kara Myers 11/13/2018  PREOPERATIVE DIAGNOSIS:  Multiparity, undesired fertility  POSTOPERATIVE DIAGNOSIS:  Multiparity, undesired fertility  PROCEDURE:  Postpartum Bilateral Tubal Sterilization using Filshie Clips   ANESTHESIA:  Epidural  ATTENDING SURGEON:  Scheryl DarterJames Arnold, MD  SURGEON:  Jen MowElizabeth Azuree Minish, DO  COMPLICATIONS:  None immediate.  ESTIMATED BLOOD LOSS:  Less than 20 ml.  FLUIDS: 500 ml LR.  URINE OUTPUT:  Per anesthesia.  INDICATIONS: 28 y.o. [id number]  with undesired fertility,status post vaginal delivery, desires permanent sterilization. Risks and benefits of procedure discussed with patient including permanence of method, bleeding, infection, injury to surrounding organs and need for additional procedures. Risk failure of 0.5-1% with increased risk of ectopic gestation if pregnancy occurs was also discussed with patient.   FINDINGS:  Normal uterus, tubes, and ovaries.  TECHNIQUE:  The patient was taken to the operating room where her epidural anesthesia was dosed up to surgical level and found to be adequate.  She was then placed in the dorsal supine position and prepped and draped in sterile fashion.  After an adequate timeout was performed, attention was turned to the patient's abdomen where 6cc local anesthesia subcutaneously injected and a small transverse skin incision was made under the umbilical fold. The incision was taken down to the layer of fascia using the scalpel, and fascia was incised, and extended bilaterally using Mayo scissors. The peritoneum was entered in a sharp fashion. Attention was then turned to the patient's uterus, and rightt fallopian tube was identified and followed out to the fimbriated end.  A Filshie clip was placed on the right fallopian tube about 2 cm from the cornual attachment, with care given to incorporate the underlying mesosalpinx.  A similar process was carried out on the left side allowing for bilateral tubal sterilization.  Good  hemostasis was noted overall.  Local analgesia was drizzled on both operative sites.The instruments were then removed from the patient's abdomen and the fascial incision was repaired with 0 Vicryl, and the skin was closed with a 3-0 Monocryl subcuticular stitch. The patient tolerated the procedure well.  Sponge, lap, and needle counts were correct times two.  The patient was then taken to the recovery room awake, extubated and in stable condition.   Jen MowELIZABETH Mackynzie Woolford, DO OB Fellow, [facility] 11/13/2018  12:11 PM

## 2018-11-13 NOTE — Progress Notes (Signed)
LABOR PROGRESS NOTE  Kara Myers is a 28 y.o. [phone] at [redacted]w[redacted]d admitted for IOL for postdates  Subjective: Feeling pressure with contractions  Objective: BP (!) 153/84   Pulse 95   Temp 97.9 F (36.6 C) (Axillary)   Resp 18   Ht 5\' 3"  (1.6 m)   Wt 115.7 kg   LMP 01/29/2018 (Approximate)   SpO2 100%   BMI 45.17 kg/m  or  Vitals:   11/13/18 0101 11/13/18 0103 11/13/18 0131 11/13/18 0201  BP: (!) 130/115 138/76 (!) 150/82 (!) 153/84  Pulse:  97 90 95  Resp:      Temp:      TempSrc:      SpO2:      Weight:      Height:        Dilation: 10 Dilation Complete Date: 11/13/18 Dilation Complete Time: 0213 Effacement (%): 100 Cervical Position: Posterior Station: Plus 2 Presentation: Vertex Exam by:: [name] FHT: baseline rate 120s, moderate varibility, + acel, variable decel Toco: regular q3-40m  Labs: Lab Results  Component Value Date   WBC 8.5 11/12/2018   HGB 11.6 (L) 11/12/2018   HCT 35.3 (L) 11/12/2018   MCV 88.5 11/12/2018   PLT 286 11/12/2018    Patient Active Problem List   Diagnosis Date Noted  . Post-dates pregnancy 11/12/2018  . BMI 40.0-44.9, adult (HCC) 10/30/2018  . Sickle cell trait (HCC) 07/14/2018  . History of gestational hypertension 07/02/2018  . Supervision of high risk pregnancy, antepartum 07/02/2018  . Obesity in pregnancy 07/02/2018    Assessment / Plan: 28 y.o. [id number] at [redacted]w[redacted]d here for IOL for postdates  Labor: complete and feeling pressure Fetal Wellbeing:  Cat 2 (reassuring) Pain Control:  Epidural  Anticipated MOD:  SVD  Gwenevere Abbot, MD  OB Fellow  11/13/2018, 2:14 AM

## 2018-11-13 NOTE — Transfer of Care (Signed)
Immediate Anesthesia Transfer of Care Note  Patient: Kara Myers  Procedure(s) Performed: POST PARTUM TUBAL LIGATION (N/A )  Patient Location: PACU  Anesthesia Type:Epidural  Level of Consciousness: awake, alert  and oriented  Airway & Oxygen Therapy: Patient Spontanous Breathing  Post-op Assessment: Report given to RN and Post -op Vital signs reviewed and stable  Post vital signs: Reviewed and stable  Last Vitals:  Vitals Value Taken Time  BP    Temp    Pulse    Resp    SpO2      Last Pain:  Vitals:   11/13/18 1001  TempSrc: Oral  PainSc:          Complications: No apparent anesthesia complications

## 2018-11-13 NOTE — Progress Notes (Signed)
28 y.o. [id number] with undesired fertility desires permanent sterilization. Risks and benefits of postpartum tubal sterilization procedure was discussed with the patient including permanence of method, bleeding, infection, injury to surrounding organs, use of Filshie clips, anesthesia and need for additional procedures. Risk failure of 0.5-1% with increased risk of ectopic gestation if pregnancy occurs was also discussed with patient. Patient verbalized understanding and all questions were answered.  Currie Paris Debroah Loop MD 11/13/2018

## 2018-11-13 NOTE — Anesthesia Preprocedure Evaluation (Signed)
Anesthesia Evaluation  Patient identified by MRN, date of birth, ID band Patient awake    Reviewed: Allergy & Precautions, H&P , NPO status , Patient's Chart, lab work & pertinent test results  Airway Mallampati: II   Neck ROM: full    Dental   Pulmonary former smoker,    breath sounds clear to auscultation       Cardiovascular hypertension,  Rhythm:regular Rate:Normal     Neuro/Psych    GI/Hepatic   Endo/Other  Morbid obesity  Renal/GU      Musculoskeletal   Abdominal   Peds  Hematology  (+) Blood dyscrasia, Sickle cell trait ,   Anesthesia Other Findings   Reproductive/Obstetrics                             Anesthesia Physical Anesthesia Plan  ASA: II  Anesthesia Plan: Epidural   Post-op Pain Management:    Induction: Intravenous  PONV Risk Score and Plan: 2 and Ondansetron and Treatment may vary due to age or medical condition  Airway Management Planned: Simple Face Mask  Additional Equipment:   Intra-op Plan:   Post-operative Plan:   Informed Consent: I have reviewed the patients History and Physical, chart, labs and discussed the procedure including the risks, benefits and alternatives for the proposed anesthesia with the patient or authorized representative who has indicated his/her understanding and acceptance.       Plan Discussed with: CRNA, Surgeon and Anesthesiologist  Anesthesia Plan Comments:         Anesthesia Quick Evaluation

## 2018-11-14 NOTE — Addendum Note (Signed)
Addendum  created 11/14/18 1603 by Graciela HusbandsFussell, Daegan Arizmendi O, CRNA   Clinical Note Signed

## 2018-11-14 NOTE — Anesthesia Postprocedure Evaluation (Signed)
Anesthesia Post Note  Patient: Kara Myers  Procedure(s) Performed: POST PARTUM TUBAL LIGATION (N/A )     Patient location during evaluation: Mother Baby Anesthesia Type: Epidural Level of consciousness: awake and alert and oriented Pain management: satisfactory to patient Vital Signs Assessment: post-procedure vital signs reviewed and stable Respiratory status: respiratory function stable Cardiovascular status: stable Postop Assessment: no headache, no backache, epidural receding, patient able to bend at knees, no signs of nausea or vomiting and adequate PO intake Anesthetic complications: no    Last Vitals:  Vitals:   11/14/18 0100 11/14/18 0526  BP: 125/72 122/74  Pulse: 69 68  Resp: 16 18  Temp:  36.7 C  SpO2:  98%    Last Pain:  Vitals:   11/14/18 1241  TempSrc:   PainSc: 2    Pain Goal:                   [name]

## 2018-11-14 NOTE — Progress Notes (Addendum)
Post Partum/Op Day 1 from SVD and BTL  Subjective: up ad lib, voiding, tolerating PO and + flatus. The patient continues to have mild lower abdominal pain. She is nervous to go home today since she had her BTL and does not want early discharge. Otherwise she feels fine. She has not been ambulating the hallway yet and I encouraged her to do so.  Objective: Blood pressure 122/74, pulse 68, temperature 98 F (36.7 C), temperature source Oral, resp. rate 18, height '5\' 3"'$  (1.6 m), weight 115.7 kg, last menstrual period 01/29/2018, SpO2 98 %, unknown if currently breastfeeding.  Physical Exam:  General: alert, cooperative and no distress Lochia: appropriate Uterine Fundus: firm Incision, umbilical: healing well, no significant drainage, Honeycomb in place DVT Evaluation: No evidence of DVT seen on physical exam. No significant calf/ankle edema.  Recent Labs    11/12/18 0932  HGB 11.6*  HCT 35.3*    Assessment/Plan: Plan for discharge tomorrow, Breastfeeding and Contraception BTL performed 11/13/2018   LOS: 2 days   Kara Myers 11/14/2018, 8:50 AM   I personally saw and evaluated the patient, performing the key elements of the service. I developed and verified the management plan that is described in the resident's/student's note, and I agree with the content with my edits above. VSS, HRR&R, Resp unlabored, Legs neg.  Nigel Berthold, CNM 11/17/2018 12:33 PM

## 2018-11-15 DIAGNOSIS — O139 Gestational [pregnancy-induced] hypertension without significant proteinuria, unspecified trimester: Secondary | ICD-10-CM | POA: Diagnosis present

## 2018-11-15 MED ORDER — AMLODIPINE BESYLATE 5 MG PO TABS: 5.0000 mg | ORAL_TABLET | Freq: Every day | ORAL | 1 refills | Status: DC

## 2018-11-15 MED ORDER — IBUPROFEN 600 MG PO TABS: 600.0000 mg | ORAL_TABLET | Freq: Four times a day (QID) | ORAL | 0 refills | Status: DC | PRN

## 2018-11-15 MED ORDER — OXYCODONE-ACETAMINOPHEN 5-325 MG PO TABS: 1.0000 | ORAL_TABLET | ORAL | 0 refills | Status: AC | PRN

## 2018-11-15 MED ORDER — AMLODIPINE BESYLATE 5 MG PO TABS
5.0000 mg | ORAL_TABLET | Freq: Every day | ORAL | Status: DC
  Filled 2018-11-15 (×2): qty 1

## 2018-11-15 NOTE — Discharge Instructions (Signed)
Postpartum Tubal Ligation, Care After °Refer to this sheet in the next few weeks. These instructions provide you with information about caring for yourself after your procedure. Your health care provider may also give you more specific instructions. Your treatment has been planned according to current medical practices, but problems sometimes occur. Call your health care provider if you have any problems or questions after your procedure. °What can I expect after the procedure? °After the procedure, it is common to have: °· A sore throat. °· Bruising or pain in your back. °· Nausea or vomiting. °· Dizziness. °· Mild abdominal discomfort or pain, such as cramping, gas pain, or feeling bloated. °· Soreness where the incision was made. °· Tiredness. °· Pain in your shoulders. °Follow these instructions at home: °Medicines °· Take over-the-counter and prescription medicines only as told by your health care provider. °· Do not take aspirin because it can cause bleeding. °· Do not drive or operate heavy machinery while taking prescription pain medicine. °Activity °· Rest for the rest of the day. °· Gradually return to your normal activities over the next few days. °· Do not have sex, douche, or put a tampon or anything else in your vagina for 6 weeks or as long as told by your health care provider. °· Do not lift anything that is heavier than your baby for 2 weeks or as long as told by your health care provider. °Incision care ° °  ° °· Follow instructions from your health care provider about how to take care of your incision. Make sure you: °? Wash your hands with soap and water before you change your bandage (dressing). If soap and water are not available, use hand sanitizer. °? Change your dressing as told by your health care provider. °? Leave stitches (sutures) in place. They may need to stay in place for 2 weeks or longer. °· Check your incision area every day for signs of infection. Check for: °? More redness,  swelling, or pain. °? More fluid or blood. °? Warmth. °? Pus or a bad smell. °Other Instructions °· Do not take baths, swim, or use a hot tub until your health care provider approves. You may take showers. °· Keep all follow-up visits as told by your health care provider. This is important. °Contact a health care provider if: °· You have more redness, swelling, or pain around your incision. °· Your incision feels warm to the touch. °· You have pus or a bad smell coming from your incision. °· The edges of your incision break open after the sutures have been removed. °· Your pain does not improve after 2-3 days. °· You have a rash. °· You repeatedly become dizzy or lightheaded. °· Your pain medicine is not helping. °· You are constipated. °Get help right away if: °· You have a fever. °· You faint. °· You have pain in your abdomen that gets worse. °· You have fluid or blood coming from your sutures. °· You have shortness of breath or difficulty breathing. °· You have chest pain or leg pain. °· You have ongoing nausea or diarrhea. °This information is not intended to replace advice given to you by your health care provider. Make sure you discuss any questions you have with your health care provider. °Document Released: 03/25/2012 Document Revised: 05/21/2017 Document Reviewed: 09/04/2015 °Elsevier Interactive Patient Education © 2019 [employer] °Vaginal Delivery, Care After °Refer to this sheet in the next few weeks. These instructions provide you with information about caring   for yourself after vaginal delivery. Your health care provider may also give you more specific instructions. Your treatment has been planned according to current medical practices, but problems sometimes occur. Call your health care provider if you have any problems or questions. °What can I expect after the procedure? °After vaginal delivery, it is common to have: °· Some bleeding from your vagina. °· Soreness in your abdomen, your vagina, and  the area of skin between your vaginal opening and your anus (perineum). °· Pelvic cramps. °· Fatigue. °Follow these instructions at home: °Medicines °· Take over-the-counter and prescription medicines only as told by your health care provider. °· If you were prescribed an antibiotic medicine, take it as told by your health care provider. Do not stop taking the antibiotic until it is finished. °Driving ° °· Do not drive or operate heavy machinery while taking prescription pain medicine. °· Do not drive for 24 hours if you received a sedative. °Lifestyle °· Do not drink alcohol. This is especially important if you are breastfeeding or taking medicine to relieve pain. °· Do not use tobacco products, including cigarettes, chewing tobacco, or e-cigarettes. If you need help quitting, ask your health care provider. °Eating and drinking °· Drink at least 8 eight-ounce glasses of water every day unless you are told not to by your health care provider. If you choose to breastfeed your baby, you may need to drink more water than this. °· Eat high-fiber foods every day. These foods may help prevent or relieve constipation. High-fiber foods include: °? Whole grain cereals and breads. °? Brown rice. °? Beans. °? Fresh fruits and vegetables. °Activity °· Return to your normal activities as told by your health care provider. Ask your health care provider what activities are safe for you. °· Rest as much as possible. Try to rest or take a nap when your baby is sleeping. °· Do not lift anything that is heavier than your baby or 10 lb (4.5 kg) until your health care provider says that it is safe. °· Talk with your health care provider about when you can engage in sexual activity. This may depend on your: °? Risk of infection. °? Rate of healing. °? Comfort and desire to engage in sexual activity. °Vaginal Care °· If you have an episiotomy or a vaginal tear, check the area every day for signs of infection. Check for: °? More redness,  swelling, or pain. °? More fluid or blood. °? Warmth. °? Pus or a bad smell. °· Do not use tampons or douches until your health care provider says this is safe. °· Watch for any blood clots that may pass from your vagina. These may look like clumps of dark red, brown, or black discharge. °General instructions °· Keep your perineum clean and dry as told by your health care provider. °· Wear loose, comfortable clothing. °· Wipe from front to back when you use the toilet. °· Ask your health care provider if you can shower or take a bath. If you had an episiotomy or a perineal tear during labor and delivery, your health care provider may tell you not to take baths for a certain length of time. °· Wear a bra that supports your breasts and fits you well. °· If possible, have someone help you with household activities and help care for your baby for at least a few days after you leave the hospital. °· Keep all follow-up visits for you and your baby as told by your health care provider.   This is important. °Contact a health care provider if: °· You have: °? Vaginal discharge that has a bad smell. °? Difficulty urinating. °? Pain when urinating. °? A sudden increase or decrease in the frequency of your bowel movements. °? More redness, swelling, or pain around your episiotomy or vaginal tear. °? More fluid or blood coming from your episiotomy or vaginal tear. °? Pus or a bad smell coming from your episiotomy or vaginal tear. °? A fever. °? A rash. °? Little or no interest in activities you used to enjoy. °? Questions about caring for yourself or your baby. °· Your episiotomy or vaginal tear feels warm to the touch. °· Your episiotomy or vaginal tear is separating or does not appear to be healing. °· Your breasts are painful, hard, or turn red. °· You feel unusually sad or worried. °· You feel nauseous or you vomit. °· You pass large blood clots from your vagina. If you pass a blood clot from your vagina, save it to show to  your health care provider. Do not flush blood clots down the toilet without having your health care provider look at them. °· You urinate more than usual. °· You are dizzy or light-headed. °· You have not breastfed at all and you have not had a menstrual period for 12 weeks after delivery. °· You have stopped breastfeeding and you have not had a menstrual period for 12 weeks after you stopped breastfeeding. °Get help right away if: °· You have: °? Pain that does not go away or does not get better with medicine. °? Chest pain. °? Difficulty breathing. °? Blurred vision or spots in your vision. °? Thoughts about hurting yourself or your baby. °· You develop pain in your abdomen or in one of your legs. °· You develop a severe headache. °· You faint. °· You bleed from your vagina so much that you fill two sanitary pads in one hour. °This information is not intended to replace advice given to you by your health care provider. Make sure you discuss any questions you have with your health care provider. °Document Released: 09/21/2000 Document Revised: 03/07/2016 Document Reviewed: 10/09/2015 °Elsevier Interactive Patient Education © 2019 Elsevier Inc. ° °

## 2018-11-15 NOTE — Lactation Note (Signed)
This note was copied from a baby's chart. Lactation Consultation Note  Patient Name: Kara Myers [facility] Date: 11/15/2018   In case Mom resumes breastfeeding, Mom noted to be taking amlodipine 5 mg (L3).   Lurline Hare [facility] 11/15/2018, 10:02 AM

## 2018-11-15 NOTE — Progress Notes (Signed)
Patient requested to see CNM regarding BP medication. Patient refusing to take Norvasc, stating we should have started it sooner if we were concerned. Discussed with patient reasons for starting at discharge and risks of postpartum readmission for HTN. Lengthy discussion of symptoms of preeclampsia and plan to have BP check this week. Patient still refusing to take Norvasc inpatient stating she will pick up the RX and take it at home.   Rolm Bookbinder, CNM 11/15/18 11:42 AM

## 2018-11-17 ENCOUNTER — Telehealth: Payer: Self-pay | Admitting: Emergency Medicine

## 2018-11-17 NOTE — Telephone Encounter (Signed)
Pt called the front desk and stated that she had a high blood pressure reading of 150/77 at home. Pt reports being discharged on 2/8 following giving birth on 2/8. Pt wanted to know whether she needed to be right away or make an appointment in the clinic.   Pt denies blurred vision or dizziness. Pt reported having a mild headache that was relieved by ibuprofen and stated she feels fine. Per Dr. Debroah Loop, pt did not need to go to the emergency department to be seen right away. Pt was advised to make a nurse visit for 2/11 to have her blood pressured evaluated. Pt verbalized understanding and had no further questions.

## 2018-11-18 ENCOUNTER — Ambulatory Visit (INDEPENDENT_AMBULATORY_CARE_PROVIDER_SITE_OTHER): Payer: Commercial Managed Care - PPO

## 2018-11-18 ENCOUNTER — Encounter (HOSPITAL_COMMUNITY): Payer: Self-pay

## 2018-11-18 VITALS — BP 141/96 | HR 72 | Wt 239.5 lb

## 2018-11-18 DIAGNOSIS — Z0130 Encounter for examination of blood pressure without abnormal findings: Secondary | ICD-10-CM

## 2018-11-18 NOTE — Progress Notes (Signed)
Subjective:  Kara Myers is a 28 y.o. female here for BP check.   Hypertension ROS: Patient reports taking medication that starts with an A but not aspirin at this time. She complains of some dizziness at this time.No swelling or blurred vision. I have discuss blood pressure with Dr.Phillips who suggest keeping patient on Amlodipine 5 mg and recheck in one week.If pressure is still elevated she will need to increase to amlodipine 10 mg.   Objective:  Wt 239 lb 8 oz (108.6 kg)   LMP 01/29/2018 (Approximate)   BMI 42.43 kg/m   Appearance alert, well appearing, and in no distress. General exam BP noted to be well controlled today in office.    Assessment:   Blood Pressure 133/96   Plan:  Follow up in one week  Around 11/24/2018 for blood pressure check.

## 2018-11-26 ENCOUNTER — Telehealth: Payer: Self-pay | Admitting: Family Medicine

## 2018-11-26 DIAGNOSIS — O139 Gestational [pregnancy-induced] hypertension without significant proteinuria, unspecified trimester: Secondary | ICD-10-CM

## 2018-11-26 MED ORDER — AMLODIPINE BESYLATE 10 MG PO TABS: 10.0000 mg | ORAL_TABLET | Freq: Every day | ORAL | 2 refills | Status: DC

## 2018-11-26 NOTE — Telephone Encounter (Signed)
Thana Farr currently postpartum   Consult from [facility] due to elevated BP. Currently 150/110 and on Amlodipine 5mg .    Plan:  - Increase to Amlodipine 10mg  daily - Recheck BP on Monday with [facility] RN - Counseled on warning s/sx of preeclampsia

## 2018-12-02 ENCOUNTER — Telehealth: Payer: Self-pay | Admitting: Family Medicine

## 2018-12-02 NOTE — Telephone Encounter (Signed)
Attempted to call patient to get her scheduled for her high BP per home nurse needs to be seen. No answer, lvm to give the office a call back to get scheduled

## 2018-12-03 ENCOUNTER — Ambulatory Visit (INDEPENDENT_AMBULATORY_CARE_PROVIDER_SITE_OTHER): Payer: Commercial Managed Care - PPO | Admitting: *Deleted

## 2018-12-03 DIAGNOSIS — O139 Gestational [pregnancy-induced] hypertension without significant proteinuria, unspecified trimester: Secondary | ICD-10-CM

## 2018-12-03 MED ORDER — AMLODIPINE BESYLATE 10 MG PO TABS: 10.0000 mg | ORAL_TABLET | Freq: Two times a day (BID) | ORAL | 2 refills | Status: AC

## 2018-12-03 NOTE — Progress Notes (Signed)
Pt presents for blood pressure check.  Pt reports occasional headaches 3 times a week that are sensitive to light and pt reports seeing spots with the headaches that don't last long.  Pt reports it makes her feel like she needs to sit down and it gets better.  Pt reports Ibuprofen 600mg  can "calm them down" but they don't make them go away completely.  Pt denies any current headache or vision changes.  Pt reports checking blood pressures at home and they can range from 131/101 to 165/117. Pt advised to go to [facility]U when she sees blood pressures this high.  Manual blood pressure at this visit was 122/84.  Pt reports she is taking amlodipine 2 pills in the morning.  Pt reports she needs a refill.  Pt reports her blood pressure is high all day. Discussed with Dr. Jolayne Panther who advised that the pt needs to continue to take her medication and if she has these symptoms or this high blood pressure then she needs to come to [facility]U.  Pt verbalized understanding.    Reviewed case with Dr Ashok Pall to discuss when pt should follow up and he recommended at the end of this week.  Pt to come back for a blood pressure check on Friday 12/05/18.  Pt verbalized understanding.

## 2018-12-04 NOTE — Progress Notes (Signed)
I have reviewed the chart and agree with nursing staff's documentation of this patient's encounter.  Catalina Antigua, MD 12/04/2018 7:33 AM

## 2018-12-05 ENCOUNTER — Ambulatory Visit: Payer: Commercial Managed Care - PPO

## 2018-12-25 ENCOUNTER — Ambulatory Visit (INDEPENDENT_AMBULATORY_CARE_PROVIDER_SITE_OTHER): Payer: Commercial Managed Care - PPO | Admitting: Medical

## 2018-12-25 ENCOUNTER — Other Ambulatory Visit: Payer: Self-pay

## 2018-12-25 ENCOUNTER — Encounter: Payer: Self-pay | Admitting: Medical

## 2018-12-25 ENCOUNTER — Ambulatory Visit (INDEPENDENT_AMBULATORY_CARE_PROVIDER_SITE_OTHER): Payer: Commercial Managed Care - PPO | Admitting: Clinical

## 2018-12-25 DIAGNOSIS — F32A Depression, unspecified: Secondary | ICD-10-CM

## 2018-12-25 DIAGNOSIS — F4323 Adjustment disorder with mixed anxiety and depressed mood: Secondary | ICD-10-CM | POA: Diagnosis not present

## 2018-12-25 DIAGNOSIS — Z1389 Encounter for screening for other disorder: Secondary | ICD-10-CM

## 2018-12-25 DIAGNOSIS — F329 Major depressive disorder, single episode, unspecified: Secondary | ICD-10-CM

## 2018-12-25 DIAGNOSIS — Z8759 Personal history of other complications of pregnancy, childbirth and the puerperium: Secondary | ICD-10-CM

## 2018-12-25 MED ORDER — IBUPROFEN 600 MG PO TABS: 600.0000 mg | ORAL_TABLET | Freq: Four times a day (QID) | ORAL | 0 refills | Status: AC | PRN

## 2018-12-25 NOTE — BH Specialist Note (Signed)
Integrated Behavioral Health Initial Visit  MRN: [id number] Name: Kara Myers  Number of Integrated Behavioral Health Clinician visits:: 1/6 Session Start time: 11:42 Session End time: 12:03 Total time: 20 minutes  Type of Service: Integrated Behavioral Health- Individual/Family Interpretor:No. Interpretor Name and Language: n/a   Warm Hand Off Completed.       SUBJECTIVE: Kara Myers is a 28 y.o. female accompanied by n/a Patient was referred by Vonzella Nipple, PA-C for positive depression screen. Patient reports the following symptoms/concerns: Pt states her primary symptoms are crying spells, headaches, stress and worry postpartum; pt feels best when she is able to get outside and walk.  Duration of problem: Postpartum; Severity of problem:    OBJECTIVE: Mood: Normal and Affect: Appropriate Risk of harm to self or others: No plan to harm self or others  LIFE CONTEXT: Family and Social: Pt lives with her three children (12yo daughter, 4yo son; newborn) School/Work: - Self-Care: - Life Changes: Recent pregnancy; coronavirus pandemic and children out of school  GOALS ADDRESSED: Patient will: 1. Reduce symptoms of: anxiety and depression 2. Increase knowledge and/or ability of: healthy habits  3. Demonstrate ability to: Increase healthy adjustment to current life circumstances  INTERVENTIONS: Interventions utilized: Psychoeducation and/or Health Education  Standardized Assessments completed: Edinburgh Postnatal Depression  ASSESSMENT: Patient currently experiencing Adjustment disorder with mixed anxious and depressed mood.   Patient may benefit from psychoeducation and brief therapeutic interventions regarding coping with symptoms of depression and anxiety .  PLAN: 1. Follow up with behavioral health clinician on : As needed 2. Behavioral recommendations:  -Continue taking prenatal vitamin daily until gone, as recommended by medical provider -Take daily  walk every evening, at least 20 minutes -Consider apps provided, as additional self-care -Read educational materials regarding coping with symptoms of depression and anxiety -Take home and read "[employer]: Tips for Social Distancing, Quarantine, and Isolation During An Infectious Disease Outbreak".    3. Referral(s): Integrated Behavioral Health Services (In Clinic) 4. "From scale of 1-10, how likely are you to follow plan?": 10  Rae Lips, LCSW  Depression screen [facility] 2/9 11/10/2018 11/03/2018 10/30/2018 08/07/2018 07/02/2018  Decreased Interest 3 3 3 1 2   Down, Depressed, Hopeless 0 0 0 0 0  PHQ - 2 Score 3 3 3 1 2   Altered sleeping 2 3 3 3 2   Tired, decreased energy 3 3 3 3 2   Change in appetite 1 1 3 1 1   Feeling bad or failure about yourself  0 0 0 0 0  Trouble concentrating 0 0 0 0 0  Moving slowly or fidgety/restless 0 0 3 0 0  Suicidal thoughts 0 0 0 0 0  PHQ-9 Score 9 10 15 8 7    GAD 7 : Generalized Anxiety Score 11/10/2018 11/03/2018 10/30/2018 08/07/2018  Nervous, Anxious, on Edge 3 3 3 3   Control/stop worrying 2 0 0 2  Worry too much - different things 2 2 0 2  Trouble relaxing 2 3 3 2   Restless 0 0 0 0  Easily annoyed or irritable 3 3 3 3   Afraid - awful might happen 1 1 2 2   Total GAD 7 Score 13 12 11  14

## 2018-12-25 NOTE — Progress Notes (Signed)
Subjective:     Kara Myers is a 28 y.o. female who presents for a postpartum visit. She is 6 weeks postpartum following a spontaneous vaginal delivery. I have fully reviewed the prenatal and intrapartum course. The delivery was at 41/1 gestational weeks. Outcome: spontaneous vaginal delivery. Anesthesia: epidural. Postpartum course has been normal. Baby's course has been normal. Baby is feeding by both breast and bottle - Gerber Gentle. Bleeding no bleeding. Bowel function is normal. Bladder function is normal. Patient is sexually active. Contraception method is tubal ligation. Postpartum depression screening: positive.  The following portions of the patient's history were reviewed and updated as appropriate: allergies, current medications, past family history, past medical history, past social history, past surgical history and problem list.  Review of Systems Pertinent items are noted in HPI.   Objective:    BP 124/81   Pulse (!) 53   Temp 98.4 F (36.9 C)   Ht 5\' 3"  (1.6 m)   Wt 234 lb 14.4 oz (106.5 kg)   LMP 01/29/2018 (Approximate)   Breastfeeding No   BMI 41.61 kg/m   General:  alert and cooperative   Breasts:  deferred, no concerns  Lungs: clear to auscultation bilaterally  Heart:  regular rate and rhythm, S1, S2 normal, no murmur, click, rub or gallop  Abdomen: soft, mild tenderness at the umbilicus   Vulva:  not evaluated  Vagina: not evaluated  Cervix:  not evaluated  Corpus: not examined  Adnexa:  not evaluated  Rectal Exam: Not performed.        Assessment:     Normal postpartum exam. Pap smear not done at today's visit. Patient states had normal pap in last pregnancy at [facility]. Records requested again.  Depression  History of GHTN Plan:    1. Contraception: tubal ligation 2. Discontinue Norvasc today  3. Follow up in: 2 weeks for BP check or sooner as needed.    Marny Lowenstein, PA-C 12/25/2018 11:16 AM

## 2018-12-25 NOTE — Patient Instructions (Signed)
Laparoscopic Tubal Ligation, Care After °Refer to this sheet in the next few weeks. These instructions provide you with information about caring for yourself after your procedure. Your health care provider may also give you more specific instructions. Your treatment has been planned according to current medical practices, but problems sometimes occur. Call your health care provider if you have any problems or questions after your procedure. °What can I expect after the procedure? °After the procedure, it is common to have: °· A sore throat. °· Discomfort in your shoulder. °· Mild discomfort or cramping in your abdomen. °· Gas pains. °· Pain or soreness in the area where the surgical cut (incision) was made. °· A bloated feeling. °· Tiredness. °· Nausea. °· Vomiting. °Follow these instructions at home: °Medicines °· Take over-the-counter and prescription medicines only as told by your health care provider. °· Do not take aspirin because it can cause bleeding. °· Do not drive or operate heavy machinery while taking prescription pain medicine. °Activity °· Rest for the rest of the day. °· Return to your normal activities as told by your health care provider. Ask your health care provider what activities are safe for you. °Incision care ° °  ° °· Follow instructions from your health care provider about how to take care of your incision. Make sure you: °? Wash your hands with soap and water before you change your bandage (dressing). If soap and water are not available, use hand sanitizer. °? Change your dressing as told by your health care provider. °? Leave stitches (sutures) in place. They may need to stay in place for 2 weeks or longer. °· Check your incision area every day for signs of infection. Check for: °? More redness, swelling, or pain. °? More fluid or blood. °? Warmth. °? Pus or a bad smell. °Other Instructions °· Do not take baths, swim, or use a hot tub until your health care provider approves. You may take  showers. °· Keep all follow-up visits as told by your health care provider. This is important. °· Have someone help you with your daily household tasks for the first few days. °Contact a health care provider if: °· You have more redness, swelling, or pain around your incision. °· Your incision feels warm to the touch. °· You have pus or a bad smell coming from your incision. °· The edges of your incision break open after the sutures have been removed. °· Your pain does not improve after 2-3 days. °· You have a rash. °· You repeatedly become dizzy or light-headed. °· Your pain medicine is not helping. °· You are constipated. °Get help right away if: °· You have a fever. °· You faint. °· You have increasing pain in your abdomen. °· You have severe pain in one or both of your shoulders. °· You have fluid or blood coming from your sutures or from your vagina. °· You have shortness of breath or difficulty breathing. °· You have chest pain or leg pain. °· You have ongoing nausea, vomiting, or diarrhea. °This information is not intended to replace advice given to you by your health care provider. Make sure you discuss any questions you have with your health care provider. °Document Released: 04/13/2005 Document Revised: 05/21/2017 Document Reviewed: 09/04/2015 °Elsevier Interactive Patient Education © 2019 [employer]. ° °

## 2018-12-26 ENCOUNTER — Encounter: Payer: Self-pay | Admitting: *Deleted

## 2019-01-08 ENCOUNTER — Other Ambulatory Visit: Payer: Self-pay | Admitting: Family Medicine

## 2019-01-08 ENCOUNTER — Ambulatory Visit (INDEPENDENT_AMBULATORY_CARE_PROVIDER_SITE_OTHER): Payer: Commercial Managed Care - PPO | Admitting: *Deleted

## 2019-01-08 ENCOUNTER — Ambulatory Visit: Payer: Self-pay | Admitting: Clinical

## 2019-01-08 ENCOUNTER — Other Ambulatory Visit: Payer: Self-pay

## 2019-01-08 VITALS — BP 160/94 | Temp 99.4°F

## 2019-01-08 DIAGNOSIS — Z8759 Personal history of other complications of pregnancy, childbirth and the puerperium: Secondary | ICD-10-CM

## 2019-01-08 MED ORDER — SERTRALINE HCL 25 MG PO TABS: 25.0000 mg | ORAL_TABLET | Freq: Every day | ORAL | 3 refills | Status: AC

## 2019-01-08 NOTE — BH Specialist Note (Signed)
Integrated Behavioral Health Visit via Telemedicine (Telephone)  01/08/2019 Kara Myers [id number]   Session Start time: 1:38 Session End time: 2:08 Total time: 30 minutes  Referring Provider: Vonzella Nipple, PA-C at last visit Type of Visit: Telephonic Patient location: Home [facility] Provider location: [facility] All persons participating in visit: [facility] and Patient  Confirmed patient's address: Yes  Confirmed patient's phone number: Yes  Any changes to demographics: Yes   Confirmed patient's insurance: Yes  Any changes to patient's insurance: Yes   Discussed confidentiality: Yes    The following statements were read to the patient and/or legal guardian that are established with the [facility] Provider.  "The purpose of this phone visit is to provide behavioral health care while limiting exposure to the coronavirus (COVID19).  There is a possibility of technology failure and discussed alternative modes of communication if that failure occurs."  "By engaging in this telephone visit, you consent to the provision of healthcare.  Additionally, you authorize for your insurance to be billed for the services provided during this telephone visit."   Patient and/or legal guardian consented to telephone visit: Yes   PRESENTING CONCERNS: Patient and/or family reports the following symptoms/concerns: Pt states she has felt much  Duration of problem: Noticed  postpartum; Severity of problem: moderate  STRENGTHS (Protective Factors/Coping Skills): Supportive family; Self-awareness  GOALS ADDRESSED: Patient will: 1.  Reduce symptoms of: depression  2.  Increase knowledge and/or ability of: healthy habits  3.  Demonstrate ability to: Increase healthy adjustment to current life circumstances, Increase adequate support systems for patient/family and Increase motivation to adhere to plan of care  INTERVENTIONS: Interventions utilized:  Mining engineer, Psychoeducation and/or Health  Education and Link to [employer] Standardized Assessments completed: Not given today  ASSESSMENT: Patient currently experiencing Adjustment disorder with depressed mood.   Patient may benefit from psychoeducation and brief therapeutic interventions regarding coping with symptoms of depression .  PLAN: 1. Follow up with behavioral health clinician on : Two weeks 2. Behavioral recommendations:  -Begin taking Zoloft, as prescribed -10 minute walk daily -Self-care activities, as discussed, daily for the next two weeks -Join new mom support group online [email or web address].net  3. Referral(s): Integrated Art gallery manager (In Clinic) and [employer]:  mom support  Rae Lips

## 2019-01-08 NOTE — Progress Notes (Signed)
Chart reviewed - agree with RN documentation. Zoloft 25mg  daily prescribed for depression.

## 2019-01-08 NOTE — Progress Notes (Signed)
Here for bp check. Had GHTN. Had postpartum visit on 12/25/18 and told to stop norvasc, come back 2 weeks for bp check. Today BP 146/92, 160/94 manually. Denies headache, edema. Discussed with Dr.Stinson and instructed patient to restart norvasc and follow up with PCP. I offered to refer but she states she prefers to contact a pcp herself.  She asked if she could see/ talk to [place] [facility] re: she saw her for postpartum depression.  I informed jamie and she will call patient. Patient voices understanding.

## 2019-01-08 NOTE — Progress Notes (Signed)
Kara Myers, Kara Close, LCSW  [place], DO        Requests Zoloft for depression, she is just now coming to terms with needing medication; not suicidal or homicidal. I will follow her until she can get into long-term therapy (all backed up now, due to COVID-19)

## 2019-01-12 ENCOUNTER — Telehealth: Payer: Self-pay | Admitting: Clinical

## 2019-01-12 NOTE — Telephone Encounter (Signed)
Integrated Behavioral Health Visit via Telemedicine (Telephone)  01/12/2019 [name] [id number]   Session Start time: 9:55  Session End time: 10:00 Total time: 15 minutes  Referring Provider: Vonzella Nipple, PA-C at a previous visit Type of Visit: Telephonic Patient location: Home [facility] Provider location: [facility] All persons participating in visit: [facility] and Patient  Confirmed patient's address: Yes  Confirmed patient's phone number: Yes  Any changes to demographics: No   Confirmed patient's insurance: Yes  Any changes to patient's insurance: Yes   Discussed confidentiality: Yes  Integrated Behavioral Health Medication Management Phone Note  MRN: [id number] NAME: Kara Myers  Time Call Initiated: 9:55 Time Call Completed: 10:00 Total Call Time: 15 minutes  Current Medications:  Outpatient Medications Prior to Visit  Medication Sig Dispense Refill  . amLODipine (NORVASC) 10 MG tablet Take 1 tablet (10 mg total) by mouth 2 (two) times daily. 60 tablet 2  . aspirin EC 81 MG tablet Take 1 tablet (81 mg total) by mouth daily. (Patient not taking: Reported on 11/18/2018) 60 tablet 3  . ibuprofen (ADVIL,MOTRIN) 600 MG tablet Take 1 tablet (600 mg total) by mouth every 6 (six) hours as needed. 30 tablet 0  . oxyCODONE-acetaminophen (PERCOCET/ROXICET) 5-325 MG tablet Take 1 tablet by mouth every 4 (four) hours as needed (pain scale 4-7). (Patient not taking: Reported on 12/25/2018) 10 tablet 0  . Prenatal Vit-Fe Fumarate-FA (PRENATAL VITAMIN) 27-0.8 MG TABS Take 1 tablet by mouth daily. 60 tablet 3  . sertraline (ZOLOFT) 25 MG tablet Take 1 tablet (25 mg total) by mouth daily. 30 tablet 3   No facility-administered medications prior to visit.     Patient has been able to get all medications filled as prescribed: Yes  Patient is currently taking all medications as prescribed: Yes  Patient reports experiencing side effects: No  Patient describes feeling this way on  medications: Pt says she was able to sleep after taking the medication, and is taking at night.   Additional patient concerns: No other concerns at this time  Patient advised to schedule appointment with provider for evaluation of medication side effects or additional concerns: Yes- Agrees to a telephone follow up on April 20 at 1:00pm with [facility]   [name], LCSW

## 2019-01-26 ENCOUNTER — Ambulatory Visit: Payer: Self-pay

## 2019-01-26 ENCOUNTER — Telehealth: Payer: Self-pay | Admitting: Clinical

## 2019-01-26 NOTE — Telephone Encounter (Signed)
Pt was unaware of [facility] visit for today, as she has not checked her MyChart. Pt says "I'm feeling much better, and I'm taking the Zoloft", with no negative symptoms. Pt does not currently have a PCP, but plans to find one soon, per her insurance. Pt is aware that she has 3 refills of Zoloft remaining, and that she will need to either establish care with a PCP or psychiatrist to continue on Zoloft past the current refills.   Pt's preferred plan is to establish care with a PCP asap, who accepts her [employer], and to have the PCP take over refills of Zoloft after establishing care with PCP.   Pt does not feel she needs another appointment with [facility] at this time, but appreciates the follow-up, and will call to schedule again with [facility] if her symptoms increase prior to being established with a PCP.

## 2019-03-05 ENCOUNTER — Telehealth: Payer: Self-pay | Admitting: Family Medicine

## 2019-03-05 NOTE — Telephone Encounter (Signed)
Attempted to contact patient to get clarification on her FMLA paperwork and why we were completing it. No answer, left a voicemail for her to give the office a call back.

## 2019-07-29 ENCOUNTER — Ambulatory Visit: Payer: Commercial Managed Care - PPO | Admitting: Orthopaedic Surgery

## 2020-02-10 ENCOUNTER — Ambulatory Visit: Payer: Medicaid Other | Attending: Internal Medicine

## 2023-04-10 ENCOUNTER — Emergency Department (HOSPITAL_BASED_OUTPATIENT_CLINIC_OR_DEPARTMENT_OTHER)
Admission: EM | Admit: 2023-04-10 | Discharge: 2023-04-10 | Disposition: A | Discharge status: Home / Self Care | Payer: Medicaid Other | Attending: Emergency Medicine | Admitting: Emergency Medicine

## 2023-04-10 ENCOUNTER — Other Ambulatory Visit: Payer: Self-pay

## 2023-04-10 DIAGNOSIS — T192XXA Foreign body in vulva and vagina, initial encounter: Secondary | ICD-10-CM | POA: Insufficient documentation

## 2023-04-10 DIAGNOSIS — N898 Other specified noninflammatory disorders of vagina: Secondary | ICD-10-CM | POA: Insufficient documentation

## 2023-04-10 DIAGNOSIS — B3731 Acute candidiasis of vulva and vagina: Secondary | ICD-10-CM

## 2023-04-10 DIAGNOSIS — X58XXXA Exposure to other specified factors, initial encounter: Secondary | ICD-10-CM | POA: Insufficient documentation

## 2023-04-10 LAB — WET PREP, GENITAL
Clue Cells Wet Prep HPF POC: NONE SEEN
Sperm: NONE SEEN
Trich, Wet Prep: NONE SEEN
WBC, Wet Prep HPF POC: >=10 — AB (ref <10)

## 2023-04-10 LAB — HIV ANTIBODY (ROUTINE TESTING W REFLEX): HIV Screen 4th Generation wRfx: NONREACTIVE

## 2023-04-10 LAB — PREGNANCY, URINE: Preg Test, Ur: NEGATIVE

## 2023-04-10 MED ORDER — FLUCONAZOLE 150 MG PO TABS
150.0000 mg | ORAL_TABLET | Freq: Once | ORAL | Status: AC
  Administered 2023-04-10: 150 mg via ORAL
  Filled 2023-04-10: qty 1

## 2023-04-10 NOTE — ED Triage Notes (Signed)
Patient presents to ED via POV from home. Here with concern of a retained condom in vaginal canal. Also requesting STD testing.

## 2023-04-10 NOTE — Discharge Instructions (Addendum)
Your prep was positive for vaginal yeast.  Your other labs will return in the next 2 to 3 days.  You may follow-up on MyChart or you will be contacted if there is an abnormality. Contact a health care provider if: You have a fever. Your symptoms go away and then return. Your symptoms do not get better with treatment. Your symptoms get worse. You have new symptoms. You develop blisters in or around your vagina. You have blood coming from your vagina and it is not your menstrual period. You develop pain in your abdomen.

## 2023-04-10 NOTE — ED Provider Notes (Signed)
[place] EMERGENCY DEPARTMENT AT [facility] Provider Note   CSN: [id number] Arrival date & time: 04/10/23  1250     History  Chief Complaint  Patient presents with   Exposure to STD    Kara Myers is a 32 y.o. female who presents emergency department with suspicion for retained condom.  Patient states that she had intercourse recently and they could not find the condom afterward.  She is concerned it may still be in her vagina although she checked manually.  She is also having some discharge and a little bit of suprapubic cramping would like to be checked for STIs no fevers chills or severe pain.   Exposure to STD       Home Medications Prior to Admission medications   Medication Sig Start Date End Date Taking? Authorizing Provider  amLODipine (NORVASC) 10 MG tablet Take 1 tablet (10 mg total) by mouth 2 (two) times daily. 12/03/18   Constant, Peggy, MD  aspirin EC 81 MG tablet Take 1 tablet (81 mg total) by mouth daily. Patient not taking: Reported on 11/18/2018 07/02/18   Tamera Stands, DO  ibuprofen (ADVIL,MOTRIN) 600 MG tablet Take 1 tablet (600 mg total) by mouth every 6 (six) hours as needed. 12/25/18   Marny Lowenstein, PA-C  oxyCODONE-acetaminophen (PERCOCET/ROXICET) 5-325 MG tablet Take 1 tablet by mouth every 4 (four) hours as needed (pain scale 4-7). Patient not taking: Reported on 12/25/2018 11/15/18   Arabella Merles, CNM  Prenatal Vit-Fe Fumarate-FA (PRENATAL VITAMIN) 27-0.8 MG TABS Take 1 tablet by mouth daily. 07/02/18   Tamera Stands, DO  sertraline (ZOLOFT) 25 MG tablet Take 1 tablet (25 mg total) by mouth daily. 01/08/19   Levie Heritage, DO      Allergies    Patient has no known allergies.    Review of Systems   Review of Systems  Physical Exam Updated Vital Signs BP (!) 148/66 (BP Location: Left Arm)   Pulse 82   Temp 98.8 F (37.1 C)   Resp 18   Ht 5\' 3"  (1.6 m)   Wt 99.8 kg   SpO2 98%   BMI 38.97 kg/m  Physical  Exam Physical Exam  Nursing note and vitals reviewed. Constitutional: She is oriented to person, place, and time. She appears well-developed and well-nourished. No distress.  HENT:  Head: Normocephalic and atraumatic.  Eyes: Conjunctivae normal and EOM are normal. Pupils are equal, round, and reactive to light. No scleral icterus.  Neck: Normal range of motion.  Cardiovascular: Normal rate, regular rhythm and normal heart sounds.  Exam reveals no gallop and no friction rub.   No murmur heard. FA:OZHYQM exam: VULVA: normal appearing vulva with no masses, tenderness or lesions, VAGINA: vaginal discharge - white, copious, creamy, and curd-like, no foreign bodies, CERVIX: normal appearing cervix without discharge or lesions, cervical motion tenderness absent, UTERUS: uterus is normal size, shape, consistency and nontender, ADNEXA: normal adnexa in size, nontender and no masses, exam chaperoned by emma brame Pulmonary/Chest: Effort normal and breath sounds normal. No respiratory distress.  Abdominal: Soft. Bowel sounds are normal. She exhibits no distension and no mass. There is no tenderness. There is no guarding.  Neurological: She is alert and oriented to person, place, and time.  Skin: Skin is warm and dry. She is not diaphoretic.   ED Results / Procedures / Treatments   Labs (all labs ordered are listed, but only abnormal results are displayed) Labs Reviewed  WET PREP, GENITAL  PREGNANCY, URINE  RPR  HIV ANTIBODY (ROUTINE TESTING W REFLEX)  GC/CHLAMYDIA PROBE AMP ([place]) NOT AT [facility]    EKG None  Radiology No results found.  Procedures Procedures    Medications Ordered in ED Medications - No data to display  ED Course/ Medical Decision Making/ A&P                             Medical Decision Making Amount and/or Complexity of Data Reviewed Labs: ordered.  Risk Prescription drug management.   Patient here with complaint of vaginal discharge and vaginal foreign  body.  Differential diagnosis includes PID, TOA, vaginal infection.  Pelvic examination is benign except for curd-like discharge which showed positive yeast on wet prep.  I interpreted these results.  She is a negative pregnancy test.  Benign exam and will be discharged with a single dose of fluconazole she may follow-up on her other medications        Final Clinical Impression(s) / ED Diagnoses Final diagnoses:  None    Rx / DC Orders ED Discharge Orders     None         Arthor Captain, PA-C 04/10/23 1815    Virgina Norfolk, DO 04/12/23 2118

## 2023-04-10 NOTE — ED Notes (Signed)
Pt. Reports having protected sex but believes the condom came off during sex and that she feels the condom got lost in her.  Pt. Wants to make sure to be checked for lost condom and also have blood work done.    No distress noted in Pt.  No vaginal discharge and no bleeding.

## 2023-04-11 LAB — RPR: RPR Ser Ql: NONREACTIVE

## 2023-04-12 LAB — GC/CHLAMYDIA PROBE AMP (~~LOC~~) NOT AT ARMC
Chlamydia: NEGATIVE
Comment: NEGATIVE
Comment: NORMAL
Neisseria Gonorrhea: NEGATIVE
